# Patient Record
Sex: Female | Born: 1965 | Race: White | Hispanic: No | Marital: Single | Smoking: Never smoker
Health system: Southern US, Community
[De-identification: ages and names within clinical notes are randomized; demographics above are authoritative.]

## PROBLEM LIST (undated history)

## (undated) DIAGNOSIS — F32A Depression, unspecified: Secondary | ICD-10-CM

## (undated) DIAGNOSIS — F329 Major depressive disorder, single episode, unspecified: Secondary | ICD-10-CM

## (undated) HISTORY — DX: Depression, unspecified: F32.A

## (undated) HISTORY — DX: Major depressive disorder, single episode, unspecified: F32.9

## (undated) HISTORY — PX: TUBAL LIGATION: SHX77

## (undated) HISTORY — PX: CHOLECYSTECTOMY: SHX55

## (undated) HISTORY — PX: TONSILLECTOMY: SUR1361

---

## 1998-10-21 ENCOUNTER — Other Ambulatory Visit: Admission: RE | Admit: 1998-10-21 | Discharge: 1998-10-21 | Payer: Self-pay | Admitting: *Deleted

## 1999-11-18 ENCOUNTER — Other Ambulatory Visit: Admission: RE | Admit: 1999-11-18 | Discharge: 1999-11-18 | Payer: Self-pay | Admitting: *Deleted

## 2000-12-20 ENCOUNTER — Other Ambulatory Visit: Admission: RE | Admit: 2000-12-20 | Discharge: 2000-12-20 | Payer: Self-pay | Admitting: *Deleted

## 2002-01-02 ENCOUNTER — Other Ambulatory Visit: Admission: RE | Admit: 2002-01-02 | Discharge: 2002-01-02 | Payer: Self-pay | Admitting: *Deleted

## 2003-01-21 ENCOUNTER — Other Ambulatory Visit: Admission: RE | Admit: 2003-01-21 | Discharge: 2003-01-21 | Payer: Self-pay | Admitting: *Deleted

## 2004-02-20 ENCOUNTER — Other Ambulatory Visit: Admission: RE | Admit: 2004-02-20 | Discharge: 2004-02-20 | Payer: Self-pay | Admitting: Obstetrics and Gynecology

## 2005-09-12 ENCOUNTER — Other Ambulatory Visit: Admission: RE | Admit: 2005-09-12 | Discharge: 2005-09-12 | Payer: Self-pay | Admitting: Obstetrics and Gynecology

## 2009-03-17 ENCOUNTER — Emergency Department (HOSPITAL_COMMUNITY): Admission: EM | Admit: 2009-03-17 | Discharge: 2009-03-17 | Payer: Self-pay | Admitting: Emergency Medicine

## 2010-01-20 ENCOUNTER — Encounter: Admission: RE | Admit: 2010-01-20 | Discharge: 2010-01-20 | Payer: Self-pay | Admitting: Obstetrics and Gynecology

## 2011-08-30 ENCOUNTER — Other Ambulatory Visit: Payer: Self-pay | Admitting: Family Medicine

## 2011-08-30 ENCOUNTER — Ambulatory Visit
Admission: RE | Admit: 2011-08-30 | Discharge: 2011-08-30 | Disposition: A | Discharge status: Home / Self Care | Payer: BC Managed Care – PPO | Source: Ambulatory Visit | Attending: Family Medicine | Admitting: Family Medicine

## 2011-08-30 DIAGNOSIS — R1011 Right upper quadrant pain: Secondary | ICD-10-CM

## 2011-09-02 ENCOUNTER — Other Ambulatory Visit (HOSPITAL_COMMUNITY): Payer: Self-pay | Admitting: Family Medicine

## 2011-09-02 DIAGNOSIS — R1011 Right upper quadrant pain: Secondary | ICD-10-CM

## 2011-09-07 ENCOUNTER — Encounter (HOSPITAL_COMMUNITY)
Admission: RE | Admit: 2011-09-07 | Discharge: 2011-09-07 | Disposition: A | Discharge status: Home / Self Care | Payer: BC Managed Care – PPO | Source: Ambulatory Visit | Attending: Family Medicine | Admitting: Family Medicine

## 2011-09-07 DIAGNOSIS — R1011 Right upper quadrant pain: Secondary | ICD-10-CM

## 2011-09-07 MED ORDER — TECHNETIUM TC 99M MEBROFENIN IV KIT
5.5000 | PACK | Freq: Once | INTRAVENOUS | Status: AC | PRN
  Administered 2011-09-07: 5.5 via INTRAVENOUS

## 2011-10-14 ENCOUNTER — Other Ambulatory Visit: Payer: Self-pay | Admitting: Obstetrics and Gynecology

## 2011-10-14 DIAGNOSIS — N63 Unspecified lump in unspecified breast: Secondary | ICD-10-CM

## 2011-10-20 ENCOUNTER — Other Ambulatory Visit: Payer: BC Managed Care – PPO

## 2011-10-27 ENCOUNTER — Ambulatory Visit
Admission: RE | Admit: 2011-10-27 | Discharge: 2011-10-27 | Disposition: A | Discharge status: Home / Self Care | Payer: BC Managed Care – PPO | Source: Ambulatory Visit | Attending: Obstetrics and Gynecology | Admitting: Obstetrics and Gynecology

## 2011-10-27 ENCOUNTER — Other Ambulatory Visit: Payer: Self-pay | Admitting: Obstetrics and Gynecology

## 2011-10-27 DIAGNOSIS — N63 Unspecified lump in unspecified breast: Secondary | ICD-10-CM

## 2013-02-11 ENCOUNTER — Ambulatory Visit (INDEPENDENT_AMBULATORY_CARE_PROVIDER_SITE_OTHER): Payer: BC Managed Care – PPO | Admitting: Surgery

## 2013-02-11 ENCOUNTER — Encounter (INDEPENDENT_AMBULATORY_CARE_PROVIDER_SITE_OTHER): Payer: Self-pay | Admitting: Surgery

## 2013-02-11 VITALS — BP 140/84 | HR 88 | Resp 14 | Ht 61.0 in | Wt 212.2 lb

## 2013-02-11 DIAGNOSIS — R1011 Right upper quadrant pain: Secondary | ICD-10-CM

## 2013-02-11 NOTE — Progress Notes (Signed)
Patient ID: SOLITA MACADAM, female   DOB: [birth year], 47 y.o.   MRN: [id number]  No chief complaint on file.   HPI HENNESY SOBALVARRO is a 47 y.o. female.  Patient sent at the request of Dr. Dulce Sellar for right upper quadrant pain after eating. She has a two year history of intermittent right upper quadrant pain in her abdomen which is sharp in nature after eating. The symptoms come and go but never fully resolved. She has undergone extensive workup which shows no underlying cause. Ultrasound, EGD and HIDA showed no reason for her pain. The pain is sharp in nature at times severe lasting hours to days. No specific food can reproduce symptoms consistently. HPI  Past Medical History  Diagnosis Date  . Depression     Past Surgical History  Procedure Laterality Date  . Tonsillectomy      Family History  Problem Relation Age of Onset  . Depression Mother   . Hypertension Mother   . Diabetes Father   . Stroke Father   . Depression Sister   . Hypertension Sister     Social History History  Substance Use Topics  . Smoking status: Never Smoker   . Smokeless tobacco: Not on file  . Alcohol Use: Yes    No Known Allergies  Current Outpatient Prescriptions  Medication Sig Dispense Refill  . cetirizine (ZYRTEC) 10 MG tablet Take 10 mg by mouth daily.      Marland Kitchen dicyclomine (BENTYL) 10 MG capsule       . escitalopram (LEXAPRO) 20 MG tablet        No current facility-administered medications for this visit.    Review of Systems Review of Systems  Constitutional: Positive for unexpected weight change.  HENT: Positive for ear pain and congestion.   Respiratory: Positive for cough.   Cardiovascular: Negative.   Gastrointestinal: Positive for abdominal pain.  Genitourinary: Negative.   Musculoskeletal: Positive for arthralgias.  Neurological: Negative.     Blood pressure 140/84, pulse 88, resp. rate 14, height 5\' 1"  (1.549 m), weight 212 lb 3.2 oz (96.253 kg).  Physical  Exam Physical Exam  Constitutional: She is oriented to person, place, and time. She appears well-developed and well-nourished.  HENT:  Head: Normocephalic and atraumatic.  Eyes: EOM are normal. Pupils are equal, round, and reactive to light.  Neck: Neck supple.  Cardiovascular: Normal rate.   Pulmonary/Chest: Effort normal and breath sounds normal.  Abdominal: Soft. Bowel sounds are normal. She exhibits no distension. There is no tenderness.  Neurological: She is alert and oriented to person, place, and time.  Skin: Skin is warm and dry.  Psychiatric: She has a normal mood and affect. Her behavior is normal. Judgment and thought content normal.    Data Reviewed Dr Hulen Shouts notes,  EGD report  HIDA and U/S  ABDOMEN  Assessment    Right upper quadrant abdominal pain with negative workup    Plan    Long discussion about options. Workup is negative at this point in time if she continues to have postprandial right upper quadrant pain after eating. Benefit of cholecystectomy at relieving symptoms roughly 50%. Medical management discussed as well. Risks discussed of surgery. She would like to proceed.The procedure has been discussed with the patient. Operative and non operative treatments have been discussed. Risks of surgery include bleeding, infection,  Common bile duct injury,  Injury to the stomach,liver, colon,small intestine, abdominal wall,  Diaphragm,  Major blood vessels,  And the need for an open  procedure.  Other risks include worsening of medical problems, death,  DVT and pulmonary embolism, and cardiovascular events.   Medical options have also been discussed. The patient has been informed of long term expectations of surgery and non surgical options,  The patient agrees to proceed.         Carlos Heber A. 02/11/2013, 10:01 AM

## 2013-02-11 NOTE — Patient Instructions (Signed)

## 2013-02-14 ENCOUNTER — Encounter (INDEPENDENT_AMBULATORY_CARE_PROVIDER_SITE_OTHER): Payer: Self-pay

## 2013-04-09 ENCOUNTER — Telehealth (INDEPENDENT_AMBULATORY_CARE_PROVIDER_SITE_OTHER): Payer: Self-pay

## 2013-04-09 NOTE — Telephone Encounter (Signed)
Message copied by Brennan Bailey on Tue Apr 09, 2013  3:05 PM ------      Message from: Jari Sportsman      Created: Mon Apr 08, 2013 12:19 PM      Regarding: pt states coordination case w OB/GYN       Pt called wants to coordinate surgery with Dr Jannet Mantis at [facility]      Katie  ------

## 2013-04-09 NOTE — Telephone Encounter (Signed)
LMOM. She will need to come in for follow up visit to schedule surgery since its been 2 months since she has been seen.

## 2013-04-15 ENCOUNTER — Ambulatory Visit (INDEPENDENT_AMBULATORY_CARE_PROVIDER_SITE_OTHER): Payer: BC Managed Care – PPO | Admitting: Surgery

## 2013-04-15 ENCOUNTER — Encounter (INDEPENDENT_AMBULATORY_CARE_PROVIDER_SITE_OTHER): Payer: Self-pay | Admitting: Surgery

## 2013-04-15 VITALS — BP 144/88 | HR 108 | Temp 97.6°F | Resp 18 | Ht 61.0 in | Wt 220.0 lb

## 2013-04-15 DIAGNOSIS — K828 Other specified diseases of gallbladder: Secondary | ICD-10-CM

## 2013-04-15 NOTE — Progress Notes (Signed)
Patient ID: Chloe Meyer, female   DOB: Aug 18, 1966, 47 y.o.   MRN: [id number]  Chief Complaint  Patient presents with  . Routine Post Op    reck GB discuss sx    HPI [name] is a 47 y.o. female.  Patient sent at the request of Dr. Dulce Sellar for right upper quadrant pain after eating. She has a two year history of intermittent right upper quadrant pain in her abdomen which is sharp in nature after eating. The symptoms come and go but never fully resolved. She has undergone extensive workup which shows no underlying cause. Ultrasound, EGD and HIDA showed no reason for her pain. The pain is sharp in nature at times severe lasting hours to days. No specific food can reproduce symptoms consistently. She returns today to talk about surgery and also about coordinating tubal ligation. HPI  Past Medical History  Diagnosis Date  . Depression     Past Surgical History  Procedure Laterality Date  . Tonsillectomy      Family History  Problem Relation Age of Onset  . Depression Mother   . Hypertension Mother   . Diabetes Father   . Stroke Father   . Depression Sister   . Hypertension Sister     Social History History  Substance Use Topics  . Smoking status: Never Smoker   . Smokeless tobacco: Not on file  . Alcohol Use: Yes    No Known Allergies  Current Outpatient Prescriptions  Medication Sig Dispense Refill  . cetirizine (ZYRTEC) 10 MG tablet Take 10 mg by mouth daily.      Marland Kitchen dicyclomine (BENTYL) 10 MG capsule       . escitalopram (LEXAPRO) 20 MG tablet        No current facility-administered medications for this visit.    Review of Systems Review of Systems  Constitutional: Positive for unexpected weight change.  HENT: Positive for ear pain and congestion.   Respiratory: Positive for cough.   Cardiovascular: Negative.   Gastrointestinal: Positive for abdominal pain.  Genitourinary: Negative.   Musculoskeletal: Positive for arthralgias.  Neurological:  Negative.     Blood pressure 144/88, pulse 108, temperature 97.6 F (36.4 C), temperature source Temporal, resp. rate 18, height 5\' 1"  (1.549 m), weight 220 lb (99.791 kg).  Physical Exam Physical Exam  Constitutional: She is oriented to person, place, and time. She appears well-developed and well-nourished.  HENT:  Head: Normocephalic and atraumatic.  Eyes: EOM are normal. Pupils are equal, round, and reactive to light.  Neck: Neck supple.  Cardiovascular: Normal rate.   Pulmonary/Chest: Effort normal and breath sounds normal.  Abdominal: Soft. Bowel sounds are normal. She exhibits no distension. There is no tenderness.  Neurological: She is alert and oriented to person, place, and time.  Skin: Skin is warm and dry.  Psychiatric: She has a normal mood and affect. Her behavior is normal. Judgment and thought content normal.    Data Reviewed Dr Hulen Shouts notes,  EGD report  HIDA and U/S  ABDOMEN  Assessment    Right upper quadrant abdominal pain with negative workup    Plan    Long discussion about options. Workup is negative at this point in time if she continues to have postprandial right upper quadrant pain after eating. Benefit of cholecystectomy at relieving symptoms roughly 50%. Medical management discussed as well. Risks discussed of surgery. She would like to proceed.The procedure has been discussed with the patient. Operative and non operative treatments have been discussed.  Risks of surgery include bleeding, infection,  Common bile duct injury,  Injury to the stomach,liver, colon,small intestine, abdominal wall,  Diaphragm,  Major blood vessels,  And the need for an open procedure.  Other risks include worsening of medical problems, death,  DVT and pulmonary embolism, and cardiovascular events.   Medical options have also been discussed. The patient has been informed of long term expectations of surgery and non surgical options,  The patient agrees to proceed. She desires tubal  ligation and will coordinate with Dr. Henderson Cloud.         Efrem Pitstick A. 04/15/2013, 12:58 PM

## 2013-04-15 NOTE — Patient Instructions (Signed)

## 2013-06-11 ENCOUNTER — Other Ambulatory Visit (INDEPENDENT_AMBULATORY_CARE_PROVIDER_SITE_OTHER): Payer: Self-pay | Admitting: Surgery

## 2013-06-11 ENCOUNTER — Other Ambulatory Visit (INDEPENDENT_AMBULATORY_CARE_PROVIDER_SITE_OTHER): Payer: Self-pay | Admitting: *Deleted

## 2013-06-11 ENCOUNTER — Other Ambulatory Visit: Payer: Self-pay | Admitting: Obstetrics and Gynecology

## 2013-06-11 DIAGNOSIS — K824 Cholesterolosis of gallbladder: Secondary | ICD-10-CM

## 2013-06-11 DIAGNOSIS — K811 Chronic cholecystitis: Secondary | ICD-10-CM

## 2013-06-11 DIAGNOSIS — R1011 Right upper quadrant pain: Secondary | ICD-10-CM

## 2013-06-11 MED ORDER — OXYCODONE-ACETAMINOPHEN 5-325 MG PO TABS: 1.0000 | ORAL_TABLET | ORAL | Status: DC | PRN

## 2013-06-11 MED ORDER — PROMETHAZINE HCL 12.5 MG PO TABS: 12.5000 mg | ORAL_TABLET | Freq: Four times a day (QID) | ORAL | Status: DC | PRN

## 2013-06-14 ENCOUNTER — Telehealth (INDEPENDENT_AMBULATORY_CARE_PROVIDER_SITE_OTHER): Payer: Self-pay

## 2013-06-14 NOTE — Telephone Encounter (Signed)
Pt is post op lap chole and tubal ligation.  She is requesting an extension on her RTW.  Medical records has received her paperwork, but she has not paid her $20 fee.  I explained that she should pay her $20 and a message would be forwarded to Dr. Luisa Hart and his nurse, so her paperwork can be processed.

## 2013-06-14 NOTE — Telephone Encounter (Signed)
RTW date extended to 07/01/13 on FMLA papaerwork. A note was given to Bonita Quin to put RTW date of 9/29 on [employer] when they get filled out.

## 2013-06-27 ENCOUNTER — Encounter (INDEPENDENT_AMBULATORY_CARE_PROVIDER_SITE_OTHER): Payer: BC Managed Care – PPO | Admitting: Surgery

## 2013-07-01 ENCOUNTER — Encounter (INDEPENDENT_AMBULATORY_CARE_PROVIDER_SITE_OTHER): Payer: Self-pay | Admitting: Surgery

## 2013-07-01 ENCOUNTER — Ambulatory Visit (INDEPENDENT_AMBULATORY_CARE_PROVIDER_SITE_OTHER): Payer: BC Managed Care – PPO | Admitting: Surgery

## 2013-07-01 VITALS — BP 140/100 | HR 84 | Temp 98.4°F | Resp 15 | Ht 61.0 in | Wt 219.4 lb

## 2013-07-01 DIAGNOSIS — Z9889 Other specified postprocedural states: Secondary | ICD-10-CM

## 2013-07-01 NOTE — Progress Notes (Signed)
she is here for a postop visit following laparoscopic cholecystectomy.  Diet is being tolerated, bowels are moving.  No problems with incisions except for stitch at umbilicus.  PE:  ABD:  Soft, incisions clean/dry/intact and solid.  Assessment:  Doing well postop.  Plan:  Lowfat diet recommended.  Activities as tolerated.  Return visit prn.

## 2013-07-01 NOTE — Patient Instructions (Signed)
Resume full activity. Return as needed.  

## 2016-07-14 ENCOUNTER — Ambulatory Visit: Payer: Self-pay | Admitting: Dietician

## 2016-08-15 ENCOUNTER — Emergency Department (HOSPITAL_COMMUNITY): Payer: PRIVATE HEALTH INSURANCE

## 2016-08-15 ENCOUNTER — Observation Stay (HOSPITAL_COMMUNITY)
Admission: EM | Admit: 2016-08-15 | Discharge: 2016-08-18 | Disposition: A | Discharge status: Home / Self Care | Payer: PRIVATE HEALTH INSURANCE | Attending: Internal Medicine | Admitting: Internal Medicine

## 2016-08-15 ENCOUNTER — Encounter (HOSPITAL_COMMUNITY): Payer: Self-pay

## 2016-08-15 DIAGNOSIS — R42 Dizziness and giddiness: Principal | ICD-10-CM | POA: Insufficient documentation

## 2016-08-15 DIAGNOSIS — E119 Type 2 diabetes mellitus without complications: Secondary | ICD-10-CM | POA: Insufficient documentation

## 2016-08-15 DIAGNOSIS — R112 Nausea with vomiting, unspecified: Secondary | ICD-10-CM | POA: Diagnosis present

## 2016-08-15 DIAGNOSIS — F32A Depression, unspecified: Secondary | ICD-10-CM

## 2016-08-15 DIAGNOSIS — Z79899 Other long term (current) drug therapy: Secondary | ICD-10-CM | POA: Insufficient documentation

## 2016-08-15 DIAGNOSIS — F329 Major depressive disorder, single episode, unspecified: Secondary | ICD-10-CM | POA: Insufficient documentation

## 2016-08-15 DIAGNOSIS — Z7984 Long term (current) use of oral hypoglycemic drugs: Secondary | ICD-10-CM | POA: Insufficient documentation

## 2016-08-15 LAB — CBC WITH DIFFERENTIAL/PLATELET
BASOS ABS: 0 10*3/uL (ref 0.0–0.1)
BASOS PCT: 0 %
Eosinophils Absolute: 0.1 10*3/uL (ref 0.0–0.7)
Eosinophils Relative: 1 %
HEMATOCRIT: 41.9 % (ref 36.0–46.0)
HEMOGLOBIN: 13.6 g/dL (ref 12.0–15.0)
LYMPHS PCT: 14 %
Lymphs Abs: 1.5 10*3/uL (ref 0.7–4.0)
MCH: 29.1 pg (ref 26.0–34.0)
MCHC: 32.5 g/dL (ref 30.0–36.0)
MCV: 89.7 fL (ref 78.0–100.0)
MONO ABS: 0.3 10*3/uL (ref 0.1–1.0)
Monocytes Relative: 3 %
NEUTROS ABS: 9.1 10*3/uL — AB (ref 1.7–7.7)
NEUTROS PCT: 82 %
Platelets: 443 10*3/uL — ABNORMAL HIGH (ref 150–400)
RBC: 4.67 MIL/uL (ref 3.87–5.11)
RDW: 13.9 % (ref 11.5–15.5)
WBC: 11 10*3/uL — ABNORMAL HIGH (ref 4.0–10.5)

## 2016-08-15 LAB — BASIC METABOLIC PANEL
ANION GAP: 8 (ref 5–15)
BUN: 14 mg/dL (ref 6–20)
CO2: 26 mmol/L (ref 22–32)
Calcium: 8.7 mg/dL — ABNORMAL LOW (ref 8.9–10.3)
Chloride: 106 mmol/L (ref 101–111)
Creatinine, Ser: 0.6 mg/dL (ref 0.44–1.00)
GLUCOSE: 163 mg/dL — AB (ref 65–99)
POTASSIUM: 4.3 mmol/L (ref 3.5–5.1)
Sodium: 140 mmol/L (ref 135–145)

## 2016-08-15 LAB — I-STAT TROPONIN, ED: Troponin i, poc: 0.01 ng/mL (ref 0.00–0.08)

## 2016-08-15 MED ORDER — ONDANSETRON HCL 4 MG/2ML IJ SOLN
4.0000 mg | Freq: Once | INTRAMUSCULAR | Status: AC
  Administered 2016-08-15: 4 mg via INTRAVENOUS
  Filled 2016-08-15: qty 2

## 2016-08-15 MED ORDER — DIAZEPAM 5 MG/ML IJ SOLN
2.5000 mg | Freq: Once | INTRAMUSCULAR | Status: AC
  Administered 2016-08-15: 2.5 mg via INTRAVENOUS
  Filled 2016-08-15: qty 2

## 2016-08-15 MED ORDER — MECLIZINE HCL 25 MG PO TABS
25.0000 mg | ORAL_TABLET | Freq: Once | ORAL | Status: AC
  Administered 2016-08-15: 25 mg via ORAL
  Filled 2016-08-15: qty 1

## 2016-08-15 MED ORDER — LORAZEPAM 2 MG/ML IJ SOLN
0.5000 mg | Freq: Once | INTRAMUSCULAR | Status: AC
  Administered 2016-08-15: 0.5 mg via INTRAVENOUS
  Filled 2016-08-15: qty 1

## 2016-08-15 NOTE — ED Notes (Signed)
Patient transported to CT 

## 2016-08-15 NOTE — ED Triage Notes (Signed)
Per EMS, Pt, from home, c/o intermittent dizziness w/ movement and nausea x 2 days.  Denies pain.  EMS reports "she is ok if she keeps her eyes closed."  4mg  Zofran given en route.

## 2016-08-15 NOTE — ED Notes (Signed)
Pt returned from CT °

## 2016-08-15 NOTE — ED Notes (Signed)
Called MRI about delay.  Pt will be taken in next 15-30 min.

## 2016-08-15 NOTE — ED Notes (Signed)
Bed: WA09 Expected date:  Expected time:  Means of arrival:  Comments: EMS- 49yo F, dizziness w/ standing

## 2016-08-15 NOTE — ED Provider Notes (Signed)
[facility]-EMERGENCY DEPT Provider Note   CSN: [id number] Arrival date & time: 08/15/16  1226     History   Chief Complaint Chief Complaint  Patient presents with  . Dizziness  . Nausea    HPI Chloe Meyer is a 50 y.o. female.  The history is provided by the patient. No language interpreter was used.  Dizziness    Chloe Meyer is a 50 y.o. female who presents to the Emergency Department complaining of dizziness.  Two days ago she began to feel dizzy/vertiginous.  Sxs are waxing and waning.  The dizziness has been constant today - no change with position.  She has associated nausea, vomiting, headache.  Denies fevers, chest pain, sob, numbness, weakness.    She ran out of lexapro a week ago and restarted it yesterday.    Past Medical History:  Diagnosis Date  . Depression     There are no active problems to display for this patient.   Past Surgical History:  Procedure Laterality Date  . CHOLECYSTECTOMY    . TONSILLECTOMY      OB History    No data available       Home Medications    Prior to Admission medications   Medication Sig Start Date End Date Taking? Authorizing Provider  cetirizine (ZYRTEC) 10 MG tablet Take 10 mg by mouth daily.   Yes Historical Provider, MD  escitalopram (LEXAPRO) 20 MG tablet Take 20 mg by mouth at bedtime.  11/30/12  Yes Historical Provider, MD  fluocinonide cream (LIDEX) 0.05 % Apply 1 application topically 2 (two) times daily as needed (for eczema on arms).   Yes Historical Provider, MD  metFORMIN (GLUCOPHAGE-XR) 500 MG 24 hr tablet Take 1 mg by mouth every evening. 06/21/16  Yes Historical Provider, MD  naproxen sodium (ANAPROX) 220 MG tablet Take 220 mg by mouth 2 (two) times daily with a meal.   Yes Historical Provider, MD    Family History Family History  Problem Relation Age of Onset  . Depression Mother   . Hypertension Mother   . Diabetes Father   . Stroke Father   . Depression Sister   . Hypertension Sister      Social History Social History  Substance Use Topics  . Smoking status: Never Smoker  . Smokeless tobacco: Never Used  . Alcohol use Yes     Allergies   Patient has no known allergies.   Review of Systems Review of Systems  Neurological: Positive for dizziness.  All other systems reviewed and are negative.    Physical Exam Updated Vital Signs BP 133/80 (BP Location: Right Arm)   Pulse 78   Temp 98.5 F (36.9 C)   Resp 18   SpO2 95% Comment: Simultaneous filing. User may not have seen previous data.  Physical Exam  Constitutional: She is oriented to person, place, and time. She appears well-developed and well-nourished.  HENT:  Head: Normocephalic and atraumatic.  TMs clear bilaterally.  Mild erythema of right auditory canal  Eyes: EOM are normal. Pupils are equal, round, and reactive to light.  Nystagmus on gaze to the left.   Cardiovascular: Normal rate and regular rhythm.   No murmur heard. Pulmonary/Chest: Effort normal and breath sounds normal. No respiratory distress.  Abdominal: Soft. There is no tenderness. There is no rebound and no guarding.  Musculoskeletal: She exhibits no edema or tenderness.  Neurological: She is alert and oriented to person, place, and time.  No facial asymmetry.  5/5 strength  in all four extremities.  Sensation to light touch intact in all four extremities.    Skin: Skin is warm and dry.  Psychiatric: She has a normal mood and affect. Her behavior is normal.  Nursing note and vitals reviewed.    ED Treatments / Results  Labs (all labs ordered are listed, but only abnormal results are displayed) Labs Reviewed - No data to display  EKG  EKG Interpretation None       Radiology No results found.  Procedures Procedures (including critical care time)  Medications Ordered in ED Medications - No data to display   Initial Impression / Assessment and Plan / ED Course  I have reviewed the triage vital signs and the  nursing notes.  Pertinent labs & imaging results that were available during my care of the patient were reviewed by me and considered in my medical decision making (see chart for details).  Clinical Course     Patient here for evaluation of vertigo and nausea. She has persistent nystagmus on examination. On repeat evaluation following meclizine and Zofran she has ongoing vertigo and nystagmus, will add Ativan. Checking MRI to eval for cerebellar lesion.  Patient care transferred pending MRI.    Final Clinical Impressions(s) / ED Diagnoses   Final diagnoses:  Vertigo    New Prescriptions New Prescriptions   No medications on file     Tilden FossaElizabeth Sianna Garofano, MD 08/16/16 1314

## 2016-08-16 ENCOUNTER — Encounter (HOSPITAL_COMMUNITY): Payer: Self-pay | Admitting: Internal Medicine

## 2016-08-16 DIAGNOSIS — R112 Nausea with vomiting, unspecified: Secondary | ICD-10-CM | POA: Diagnosis not present

## 2016-08-16 DIAGNOSIS — R42 Dizziness and giddiness: Secondary | ICD-10-CM | POA: Diagnosis not present

## 2016-08-16 DIAGNOSIS — E119 Type 2 diabetes mellitus without complications: Secondary | ICD-10-CM | POA: Diagnosis not present

## 2016-08-16 LAB — CBC
HCT: 37.8 % (ref 36.0–46.0)
Hemoglobin: 12.4 g/dL (ref 12.0–15.0)
MCH: 29.2 pg (ref 26.0–34.0)
MCHC: 32.8 g/dL (ref 30.0–36.0)
MCV: 88.9 fL (ref 78.0–100.0)
PLATELETS: 410 10*3/uL — AB (ref 150–400)
RBC: 4.25 MIL/uL (ref 3.87–5.11)
RDW: 14 % (ref 11.5–15.5)
WBC: 12 10*3/uL — AB (ref 4.0–10.5)

## 2016-08-16 LAB — COMPREHENSIVE METABOLIC PANEL
ALK PHOS: 60 U/L (ref 38–126)
ALT: 14 U/L (ref 14–54)
AST: 16 U/L (ref 15–41)
Albumin: 3.6 g/dL (ref 3.5–5.0)
Anion gap: 7 (ref 5–15)
BUN: 13 mg/dL (ref 6–20)
CALCIUM: 8.9 mg/dL (ref 8.9–10.3)
CO2: 28 mmol/L (ref 22–32)
CREATININE: 0.61 mg/dL (ref 0.44–1.00)
Chloride: 106 mmol/L (ref 101–111)
Glucose, Bld: 121 mg/dL — ABNORMAL HIGH (ref 65–99)
Potassium: 4 mmol/L (ref 3.5–5.1)
Sodium: 141 mmol/L (ref 135–145)
Total Bilirubin: 0.9 mg/dL (ref 0.3–1.2)
Total Protein: 6.5 g/dL (ref 6.5–8.1)

## 2016-08-16 LAB — PHOSPHORUS: Phosphorus: 3.4 mg/dL (ref 2.5–4.6)

## 2016-08-16 LAB — GLUCOSE, CAPILLARY
GLUCOSE-CAPILLARY: 120 mg/dL — AB (ref 65–99)
GLUCOSE-CAPILLARY: 138 mg/dL — AB (ref 65–99)
GLUCOSE-CAPILLARY: 108 mg/dL — AB (ref 65–99)
Glucose-Capillary: 123 mg/dL — ABNORMAL HIGH (ref 65–99)
Glucose-Capillary: 98 mg/dL (ref 65–99)

## 2016-08-16 LAB — MAGNESIUM: Magnesium: 2.3 mg/dL (ref 1.7–2.4)

## 2016-08-16 LAB — TSH: TSH: 0.965 u[IU]/mL (ref 0.350–4.500)

## 2016-08-16 MED ORDER — ACETAMINOPHEN 650 MG RE SUPP: 650.0000 mg | Freq: Four times a day (QID) | RECTAL | Status: DC | PRN

## 2016-08-16 MED ORDER — SODIUM CHLORIDE 0.9 % IV SOLN
INTRAVENOUS | Status: AC
  Administered 2016-08-16 (×2): via INTRAVENOUS

## 2016-08-16 MED ORDER — ACETAMINOPHEN 325 MG PO TABS
650.0000 mg | ORAL_TABLET | Freq: Four times a day (QID) | ORAL | Status: DC | PRN
  Administered 2016-08-16 – 2016-08-17 (×4): 650 mg via ORAL
  Filled 2016-08-16 (×4): qty 2

## 2016-08-16 MED ORDER — MECLIZINE HCL 25 MG PO TABS
25.0000 mg | ORAL_TABLET | Freq: Three times a day (TID) | ORAL | Status: DC | PRN
  Administered 2016-08-16: 25 mg via ORAL
  Filled 2016-08-16 (×2): qty 1

## 2016-08-16 MED ORDER — ONDANSETRON HCL 4 MG/2ML IJ SOLN: 4.0000 mg | Freq: Four times a day (QID) | INTRAMUSCULAR | Status: DC | PRN

## 2016-08-16 MED ORDER — LORAZEPAM 2 MG/ML IJ SOLN: 0.5000 mg | Freq: Four times a day (QID) | INTRAMUSCULAR | Status: DC | PRN

## 2016-08-16 MED ORDER — SODIUM CHLORIDE 0.9% FLUSH
3.0000 mL | Freq: Two times a day (BID) | INTRAVENOUS | Status: DC
  Administered 2016-08-16 – 2016-08-17 (×2): 3 mL via INTRAVENOUS

## 2016-08-16 MED ORDER — HYDROCODONE-ACETAMINOPHEN 5-325 MG PO TABS: 1.0000 | ORAL_TABLET | ORAL | Status: DC | PRN

## 2016-08-16 MED ORDER — INSULIN ASPART 100 UNIT/ML ~~LOC~~ SOLN
0.0000 [IU] | Freq: Every day | SUBCUTANEOUS | Status: DC
  Administered 2016-08-17: 3 [IU] via SUBCUTANEOUS

## 2016-08-16 MED ORDER — ONDANSETRON HCL 4 MG PO TABS
4.0000 mg | ORAL_TABLET | Freq: Four times a day (QID) | ORAL | Status: DC | PRN
Start: 2016-08-16 — End: 2016-08-18

## 2016-08-16 MED ORDER — INSULIN ASPART 100 UNIT/ML ~~LOC~~ SOLN
0.0000 [IU] | Freq: Three times a day (TID) | SUBCUTANEOUS | Status: DC
  Administered 2016-08-16: 1 [IU] via SUBCUTANEOUS
  Administered 2016-08-17: 3 [IU] via SUBCUTANEOUS
  Administered 2016-08-17: 1 [IU] via SUBCUTANEOUS

## 2016-08-16 MED ORDER — LORATADINE 10 MG PO TABS
10.0000 mg | ORAL_TABLET | Freq: Every day | ORAL | Status: DC
Start: 2016-08-16 — End: 2016-08-18
  Administered 2016-08-16 – 2016-08-18 (×3): 10 mg via ORAL
  Filled 2016-08-16 (×3): qty 1

## 2016-08-16 MED ORDER — ESCITALOPRAM OXALATE 20 MG PO TABS
20.0000 mg | ORAL_TABLET | Freq: Every day | ORAL | Status: DC
  Administered 2016-08-16 – 2016-08-17 (×3): 20 mg via ORAL
  Filled 2016-08-16 (×3): qty 1

## 2016-08-16 NOTE — Evaluation (Signed)
Physical Therapy Evaluation Patient Details Name: Chloe Meyer MRN: [id number] DOB: [birth year] Today's Date: 08/16/2016   History of Present Illness  Pt was admitted 08/15/16 for vertigo associated with nausea, worse with movement.  PMH significant for depression.  Pt states that symptoms started Saturday night and she went to sleep.  She had some symptoms Sunday but was still able to function and drive.  Symptoms became worse on Monday and she presented to ED.  Pt states that L eye feels funny.  She also states that she has had ear problems in the past, but not like this  Clinical Impression  Pt admitted with above diagnosis. Pt currently with functional limitations due to the deficits listed below (see PT Problem List).  Pt will benefit from skilled PT to increase their independence and safety with mobility to allow discharge to the venue listed below.  Pt with slight nystagmus at rest.  Pt reports constant spinning however standing and mobility aggravate symptoms.  Pt would best benefit from outpatient vestibular PT however pt likely unable to have ride so would recommend HHPT if not possible.    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       Follow Up Recommendations Home health PT;Outpatient PT (if pt could have ride to OPPT for vestibular, if not HHPT)    Equipment Recommendations  Rolling walker with 5" wheels     Recommendations for Other Services       Precautions / Restrictions Precautions Precautions: Fall Restrictions Weight Bearing Restrictions: No      Mobility  Bed Mobility Overal bed mobility: Needs Assistance Bed Mobility: Supine to Sit     Supine to sit: Supervision     General bed mobility comments: slow due to dizziness  Transfers Overall transfer level: Needs assistance Equipment used: None Transfers: Sit to/from Stand Sit to Stand: Min guard Stand pivot transfers: Min guard       General transfer comment: min/guard for safety, utilized bed rail and grab bar in bathroom  Ambulation/Gait Ambulation/Gait assistance: Min guard Ambulation Distance (Feet): 20 Feet Assistive device: None Gait Pattern/deviations: Step-through pattern;Decreased stride length     General Gait Details: very unsteady gait however held onto IV pole and furniture/walls for ambulating to/from bathroom (requested to not use BSC), min/guard for safety with dizziness, would benefit from RW next visist   Stairs            Wheelchair Mobility    Modified Rankin (Stroke Patients Only)       Balance                                             Pertinent Vitals/Pain Pain Assessment: No/denies pain Faces Pain Scale: Hurts little more Pain Location: headache Pain Descriptors / Indicators: Aching Pain Intervention(s): Limited activity within patient's tolerance;Monitored during session;Patient requesting pain meds-RN notified  Home Living Family/patient expects to be discharged to:: Private residence Living Arrangements: Alone Available Help at Discharge:  (mother can come stay; doesn't drive.  More assist at night) Type of Home:  (condo) Home Access: Level entry       Home Equipment: None      Prior Function Level of Independence: Independent         Comments: works     Higher education careers adviserHand Dominance        Extremity/Trunk Assessment   Upper Extremity  Assessment: Overall WFL for tasks assessed           Lower Extremity Assessment: Overall WFL for tasks assessed         Communication   Communication: No difficulties  Cognition Arousal/Alertness: Awake/alert Behavior During Therapy: WFL for tasks assessed/performed Overall Cognitive Status: Within Functional Limits for tasks assessed                      General Comments      Exercises     Assessment/Plan    PT Assessment Patient needs continued PT services  PT Problem List Decreased activity tolerance;Decreased balance;Other (comment);Decreased mobility (vertigo, possible R hypofunction)          PT Treatment Interventions DME instruction;Gait training;Functional mobility training;Therapeutic exercise;Therapeutic activities;Balance training (compensation techniques, gaze stability exercises)    PT Goals (Current goals can be found in the Care Plan section)  Acute Rehab PT Goals Patient Stated Goal: return to independence PT Goal Formulation: With patient Time For Goal Achievement: 08/23/16 Potential to Achieve Goals: Good    Frequency Min 3X/week   Barriers to discharge        Co-evaluation        End of Session Equipment Utilized During Treatment: Gait belt Activity Tolerance: Patient tolerated treatment well Patient left: in chair;with call bell/phone within reach (aware to use call bell for any mobility for safety) Nurse Communication: Mobility status (request for meds)    Functional Assessment Tool Used: clinical judgement Functional Limitation: Mobility: Walking and moving around Mobility: Walking and Moving Around Current Status ([id number](G8978): At least 1 percent but less than 20 percent impaired, limited or restricted Mobility: Walking and Moving Around Goal Status [phone](G8979): At least 1 percent but less than 20 percent impaired, limited or restricted    Time: [id number] PT Time Calculation (min) (ACUTE ONLY): 16 min   Charges:   PT  Evaluation $PT Eval Moderate Complexity: 1 Procedure     PT G Codes:   PT G-Codes **NOT FOR INPATIENT CLASS** Functional Assessment Tool Used: clinical judgement Functional Limitation: Mobility: Walking and moving around Mobility: Walking and Moving Around Current Status (V2536(G8978): At least 1 percent but less than 20 percent impaired, limited or restricted Mobility: Walking and Moving Around Goal Status [phone](G8979): At least 1 percent but less than 20 percent impaired, limited or restricted    Chloe Meyer,KATHrine E 08/16/2016, 4:49 PM Zenovia JarredKati Mariah Harn, PT, DPT 08/16/2016 Pager: [phone]

## 2016-08-16 NOTE — H&P (Signed)
[name]:[id number] DOB: [birth year] DOA: 08/15/2016     PCP: Clarnce Flock, RN (Inactive)   Outpatient Specialists: none   Patient coming from:   home Lives alone,  Chief Complaint: vertigo  HPI: UNKNOWN [name] is a 50 y.o. female with medical history significant of depression    Presented with vertigo for 2 days associated with nausea being made worse by movement. Not associated with any pain or headache. Made better by keeping her eyes closed. Patient called EMS she was given 4 mg of Zofran en route. Patient endorses occasional headache. No fevers or chills no chest pain or shortness of breath otherwise no associated neurological complaints. Only change in medications that she ran out of Lexapro week ago and restarted it yesterday. She had recent flu shot and did not feel well but denies any ear pain no trouble hearing no tinnitus. Reports her ears don't feel right sensation of fullness in her ears bilaterally. She never had vertigo prior to this. She reports minimal vertigo at rest with significant worsening with movement.  She has history of migraine headaches but currently has been asymptomatic for many years  Patient is known history of depression treated with Lexapro she also has been recently diagnosed with diabetes on metformin.    IN ER:  Temp (24hrs), Avg:98.6 F (37 C), Min:98.5 F (36.9 C), Max:98.6 F (37 C) Troponin 0.01 sodium 140 potassium 4.3 creatinine 0.6 WBC 11 hemoglobin 13.6 CT head unremarkable MRI brain showing nonspecific scattered foci of  white matter hyperintensity. Following Medications were ordered in ER: Medications  meclizine (ANTIVERT) tablet 25 mg (25 mg Oral Given 08/15/16 1427)  LORazepam (ATIVAN) injection 0.5 mg (0.5 mg Intravenous Given 08/15/16 1557)  ondansetron (ZOFRAN) injection 4 mg (4 mg Intravenous Given 08/15/16 1921)  diazepam (VALIUM) injection 2.5 mg (2.5 mg Intravenous Given 08/15/16 2312)  meclizine (ANTIVERT)  tablet 25 mg (25 mg Oral Given 08/15/16 2312)     Hospitalist was called for admission for symptomatic vertigo  Review of Systems:    Pertinent positives include: vertigonausea, vomiting  Constitutional:  No weight loss, night sweats, Fevers, chills, fatigue, weight loss  HEENT:  No headaches, Difficulty swallowing,Tooth/dental problems,Sore throat,  No sneezing, itching, ear ache, nasal congestion, post nasal drip,  Cardio-vascular:  No chest pain, Orthopnea, PND, anasarca, dizziness, palpitations.no Bilateral lower extremity swelling  GI:  No heartburn, indigestion, abdominal pain,  diarrhea, change in bowel habits, loss of appetite, melena, blood in stool, hematemesis Resp:  no shortness of breath at rest. No dyspnea on exertion, No excess mucus, no productive cough, No non-productive cough, No coughing up of blood.No change in color of mucus.No wheezing. Skin:  no rash or lesions. No jaundice GU:  no dysuria, change in color of urine, no urgency or frequency. No straining to urinate.  No flank pain.  Musculoskeletal:  No joint pain or no joint swelling. No decreased range of motion. No back pain.  Psych:  No change in mood or affect. No depression or anxiety. No memory loss.  Neuro: no localizing neurological complaints, no tingling, no weakness, no double vision, no gait abnormality, no slurred speech, no confusion  As per HPI otherwise 10 point review of systems negative.   Past Medical History: Past Medical History:  Diagnosis Date  . Depression    Past Surgical History:  Procedure Laterality Date  . CHOLECYSTECTOMY    . TONSILLECTOMY       Social History:  Ambulatory   Independently  reports that she has never smoked. She has never used smokeless tobacco. She reports that she drinks alcohol. She reports that she does not use drugs.  Allergies:  No Known Allergies     Family History:   Family History  Problem Relation Age of Onset  . Depression  Mother   . Hypertension Mother   . Diabetes Father   . Stroke Father   . Depression Sister   . Hypertension Sister     Medications: Prior to Admission medications   Medication Sig Start Date End Date Taking? Authorizing Provider  cetirizine (ZYRTEC) 10 MG tablet Take 10 mg by mouth daily.   Yes Historical Provider, MD  escitalopram (LEXAPRO) 20 MG tablet Take 20 mg by mouth at bedtime.  11/30/12  Yes Historical Provider, MD  fluocinonide cream (LIDEX) 0.05 % Apply 1 application topically 2 (two) times daily as needed (for eczema on arms).   Yes Historical Provider, MD  metFORMIN (GLUCOPHAGE-XR) 500 MG 24 hr tablet Take 1 mg by mouth every evening. 06/21/16  Yes Historical Provider, MD  naproxen sodium (ANAPROX) 220 MG tablet Take 220 mg by mouth 2 (two) times daily with a meal.   Yes Historical Provider, MD    Physical Exam: Patient Vitals for the past 24 hrs:  BP Temp Temp src Pulse Resp SpO2  08/15/16 2300 149/99 - - 79 - 92 %  08/15/16 2200 153/92 - - 80 16 95 %  08/15/16 2130 142/87 - - 79 16 91 %  08/15/16 2000 147/93 - - 83 19 95 %  08/15/16 1900 (!) 170/112 - - 80 14 90 %  08/15/16 1808 163/95 - - 86 18 93 %  08/15/16 1800 163/95 - - 83 14 90 %  08/15/16 1600 145/93 - - 83 10 96 %  08/15/16 1553 130/85 - - 89 19 96 %  08/15/16 1530 - - - 84 13 93 %  08/15/16 1500 130/85 - - 80 13 -  08/15/16 1439 128/79 98.6 F (37 C) Oral 74 16 96 %  08/15/16 1345 - - - 87 19 94 %  08/15/16 1232 133/80 98.5 F (36.9 C) - 78 18 95 %    1. General:  in No Acute distress 2. Psychological: Alert and   Oriented 3. Head/ENT:    Dry Mucous Membranes                          Head Non traumatic, neck supple                          Normal Dentition 4. SKIN:  decreased Skin turgor,  Skin clean Dry and intact no rash 5. Heart: Regular rate and rhythm no Murmur, Rub or gallop 6. Lungs: Clear to auscultation bilaterally, no wheezes or crackles   7. Abdomen: Soft, non-tender, Non distended 8.  Lower extremities: no clubbing, cyanosis, or edema 9. Neurologically strength 5 out of 5 in all 4 extremities cranial nerves II through XII intact, Horizontal nystagmus present 10. MSK: Normal range of motion   body mass index is unknown because there is no height or weight on file.  Labs on Admission:   Labs on Admission: I have personally reviewed following labs and imaging studies  CBC:  Recent Labs Lab 08/15/16 1342  WBC 11.0*  NEUTROABS 9.1*  HGB 13.6  HCT 41.9  MCV 89.7  PLT 443*   Basic Metabolic Panel:  Recent Labs Lab 08/15/16 1342  NA 140  K 4.3  CL 106  CO2 26  GLUCOSE 163*  BUN 14  CREATININE 0.60  CALCIUM 8.7*   GFR: Estimated Creatinine Clearance: 93.9 mL/min (by C-G formula based on SCr of 0.6 mg/dL). Liver Function Tests: No results for input(s): AST, ALT, ALKPHOS, BILITOT, PROT, ALBUMIN in the last 168 hours. No results for input(s): LIPASE, AMYLASE in the last 168 hours. No results for input(s): AMMONIA in the last 168 hours. Coagulation Profile: No results for input(s): INR, PROTIME in the last 168 hours. Cardiac Enzymes: No results for input(s): CKTOTAL, CKMB, CKMBINDEX, TROPONINI in the last 168 hours. BNP (last 3 results) No results for input(s): PROBNP in the last 8760 hours. HbA1C: No results for input(s): HGBA1C in the last 72 hours. CBG: No results for input(s): GLUCAP in the last 168 hours. Lipid Profile: No results for input(s): CHOL, HDL, LDLCALC, TRIG, CHOLHDL, LDLDIRECT in the last 72 hours. Thyroid Function Tests: No results for input(s): TSH, T4TOTAL, FREET4, T3FREE, THYROIDAB in the last 72 hours. Anemia Panel: No results for input(s): VITAMINB12, FOLATE, FERRITIN, TIBC, IRON, RETICCTPCT in the last 72 hours. Urine analysis: No results found for: COLORURINE, APPEARANCEUR, LABSPEC, PHURINE, GLUCOSEU, HGBUR, BILIRUBINUR, KETONESUR, PROTEINUR, UROBILINOGEN, NITRITE, LEUKOCYTESUR Sepsis  Labs: @LABRCNTIP (procalcitonin:4,lacticidven:4) )No results found for this or any previous visit (from the past 240 hour(s)).     UA  not ordered  No results found for: HGBA1C  Estimated Creatinine Clearance: 93.9 mL/min (by C-G formula based on SCr of 0.6 mg/dL).  BNP (last 3 results) No results for input(s): PROBNP in the last 8760 hours.   ECG REPORT  Independently reviewed Rate: 87  Rhythm: NSR ST&T Change: No acute ischemic changes   QTC 457  There were no vitals filed for this visit.   Cultures: No results found for: SDES, SPECREQUEST, CULT, REPTSTATUS   Radiological Exams on Admission: Ct Head Wo Contrast  Result Date: 08/15/2016 CLINICAL DATA:  Per EMS, Pt, from home, c/o intermittent dizziness w/ movement and nausea x 2 days. Denies pain. EMS reports "she is ok if she keeps her eyes closed." pt.states lt. Eye abnormal on exam per MD EXAM: CT HEAD WITHOUT CONTRAST TECHNIQUE: Contiguous axial images were obtained from the base of the skull through the vertex without intravenous contrast. COMPARISON:  None. FINDINGS: Brain: No evidence of acute infarction, hemorrhage, hydrocephalus, extra-axial collection or mass lesion/mass effect. Vascular: No hyperdense vessel or unexpected calcification. Skull: Normal. Negative for fracture or focal lesion. Sinuses/Orbits: No acute finding. Other: None. IMPRESSION: Negative exam. Electronically Signed   By: Norva PavlovElizabeth  Brown M.D.   On: 08/15/2016 14:33   Mr Brain Wo Contrast  Result Date: 08/15/2016 CLINICAL DATA:  Vertigo, nystagmus EXAM: MRI HEAD WITHOUT CONTRAST TECHNIQUE: Multiplanar, multiecho pulse sequences of the brain and surrounding structures were obtained without intravenous contrast. COMPARISON:  Head CT 08/15/2016 FINDINGS: Brain: No acute infarct or intraparenchymal hemorrhage. The midline structures are normal. There is multifocal hyperintense T2-weighted signal within the periventricular white matter, which may be seen in  the setting of migraine headaches or early chronic microvascular disease; however, it is also seen in normal patients of this age. No mass lesion or midline shift. No hydrocephalus or extra-axial fluid collection. No age advanced or lobar predominant atrophy. Internal Auditory Canals: There is no cerebellopontine angle mass. The cochleae and semicircular canals are normal. No focal abnormality along the course of the 7th and 8th cranial nerves. Normal porus acusticus and vestibular aqueduct  bilaterally. Vascular: Major intracranial arterial and venous sinus flow voids are preserved. No evidence of chronic microhemorrhage or amyloid angiopathy. Skull and upper cervical spine: The visualized skull base, calvarium, upper cervical spine and extracranial soft tissues are normal. Sinuses/Orbits: No fluid levels or advanced mucosal thickening. No mastoid effusion. Normal orbits. IMPRESSION: 1. Nonspecific scattered foci of white matter hyperintensity. Though this may be seen in the setting of migraine headaches or vasculopathy such as early chronic small vessel disease, it is also seen in normal patients of this age group. The pattern is not suggestive of demyelination. 2. Normal MRI of the internal auditory canals and vestibular structures. Electronically Signed   By: Deatra Robinson M.D.   On: 08/15/2016 21:29    Chart has been reviewed    Assessment/Plan   50 y.o. female with medical history significant of depression admitted for severe persistent vertigo associated with intractable nausea and vomiting  Present on Admission: Vertigo - given no evidence of CVA on MRI and associated sensation of fullness in the ears most likely peripheral in nature. Suspect Mnire's disease. Have reviewed MRI findings with neurology who feels that they nonspecific and nonpathologic. Patient would benefit from OT PT consult and ENT follow-up. We'll treat symptomatically with Atarax when necessary Ativan and rehydrate DM 2 -  hold metformin order SSI Intractable nausea vomiting secondary to vertigo  Other plan as per orders.  DVT prophylaxis:  SCD    Code Status:  FULL CODE as per patient    Family Communication:   Family not  at  Bedside    Disposition Plan:     To home once workup is complete and patient is stable                        Would benefit from PT/OT eval prior to DC   ordered                                         Consults called: none    Admission status:  obs   Level of care   tele            I have spent a total of 56 min on this admission    Annetta Deiss 08/16/2016, 1:18 AM    [facility]  Pager [phone]   after 2 AM please page floor coverage PA If 7AM-7PM, please contact the day team taking care of the patient  [email or web address]  Password [email or web address]

## 2016-08-16 NOTE — Progress Notes (Signed)
Chloe Meyer is a 50 y.o. female with medical history significant for depression presented with vertigo for 2 days associated with nausea being made worse by movement. MRI of the brain negative for stroke. PT with vestibular evalution ordered. If her symptoms do no improve, pleas get ENT evaluation .   Chloe ModyVijaya Coulson Wehner, MD [phone]

## 2016-08-16 NOTE — Progress Notes (Signed)
Physical Therapy Treatment Patient Details Name: Chloe Meyer MRN: [id number] DOB: Mar 18, 1966 Today's Date: 08/16/2016    History of Present Illness Pt was admitted 08/15/16 for vertigo associated with nausea, worse with movement.  PMH significant for depression.  Pt states that symptoms started Saturday night and she went to sleep.  She had some symptoms Sunday but was still able to function and drive.  Symptoms became worse on Monday and she presented to ED.  Pt states that L eye feels funny.  She also states that she has had ear problems in the past, but not like this    PT Comments    Pt assisted again with transfers with intention for education for compensation strategies however this did not work consistently as pt unable to stabilize gaze and nystagmus worsened.  Pt reports meds given in ED helped last night, so pt agreeable to try Meclizine again to see if this alleviates symptoms and RN notified.  Pt would benefit from use of RW upon d/c.   Follow Up Recommendations  Home health PT;Outpatient PT     Equipment Recommendations  Rolling walker with 5" wheels    Recommendations for Other Services       Precautions / Restrictions Precautions Precautions: Fall Restrictions Weight Bearing Restrictions: No    Mobility  Bed Mobility Overal bed mobility: Needs Assistance Bed Mobility: Supine to Sit     Supine to sit: Supervision     General bed mobility comments: pt up in recliner  Transfers Overall transfer level: Needs assistance Equipment used: None Transfers: Sit to/from Stand Sit to Stand: Min guard Stand pivot transfers: Min guard       General transfer comment: pt steadied herself with armrests of 3:1 and recliner, min/guard for safety due to dizziness  Ambulation/Gait   Stairs            Wheelchair Mobility    Modified Rankin (Stroke Patients Only)       Balance                                    Cognition  Arousal/Alertness: Awake/alert Behavior During Therapy: WFL for tasks assessed/performed Overall Cognitive Status: Within Functional Limits for tasks assessed                      Exercises      General Comments        Pertinent Vitals/Pain Pain Assessment: Faces Faces Pain Scale: Hurts little more Pain Location: headache Pain Descriptors / Indicators: Aching;Pressure Pain Intervention(s): Limited activity within patient's tolerance;Monitored during session;Patient requesting pain meds-RN notified    Home Living Family/patient expects to be discharged to:: Private residence Living Arrangements: Alone Available Help at Discharge:  (mother can come stay; doesn't drive.  More assist at night) Type of Home:  (condo) Home Access: Level entry     Home Equipment: None      Prior Function Level of Independence: Independent      Comments: works   PT Goals (current goals can now be found in the care plan section) Acute Rehab PT Goals Patient Stated Goal: return to independence PT Goal Formulation: With patient Time For Goal Achievement: 08/23/16 Potential to Achieve Goals: Good Additional Goals Additional Goal #1: Pt will perform compensation strategies during mobility without cues Progress towards PT goals: Progressing toward goals    Frequency    Min 3X/week  PT Plan Current plan remains appropriate    Co-evaluation PT/OT/SLP Co-Evaluation/Treatment: Yes Reason for Co-Treatment: For patient/therapist safety (vestibular) PT goals addressed during session: Mobility/safety with mobility OT goals addressed during session: ADL's and self-care;Proper use of Adaptive equipment and DME     End of Session Equipment Utilized During Treatment: Gait belt Activity Tolerance: Patient tolerated treatment well Patient left: in chair;with call bell/phone within reach     Time: 1535-1602 PT Time Calculation (min) (ACUTE ONLY): 27 min  Charges:  $Therapeutic  Activity: 8-22 mins                    G Codes:  Functional Assessment Tool Used: clinical judgement Functional Limitation: Mobility: Walking and moving around Mobility: Walking and Moving Around Current Status ([id number]([id number]): At least 1 percent but less than 20 percent impaired, limited or restricted Mobility: Walking and Moving Around Goal Status [phone](G8979): At least 1 percent but less than 20 percent impaired, limited or restricted   Manette Doto,KATHrine E 08/16/2016, 4:59 PM Zenovia JarredKati Dean Goldner, PT, DPT 08/16/2016 Pager: [phone]

## 2016-08-16 NOTE — Evaluation (Addendum)
Occupational Therapy Evaluation and Vestibular Evaluation Patient Details Name: Chloe Meyer MRN: [id number] DOB: 11/20/1965 Today's Date: 08/16/2016    History of Present Illness Pt was admitted for vertigo associated with nausea, worse with movement.  PMH significant for depression.  Pt states that symptoms started Saturday night and she went to sleep.  She had some symptoms Sunday but was still able to function and drive.  Symptoms became worse on Monday and she presented to ED.  Pt states that L eye feels funny.  She also states that she has had ear problems in the past, but not like this   Clinical Impression   Pt was admitted for the above.  Performed limited vestibular evaluation as pt has slight nystagmus at rest, and it becomes worse with focusing on object.  Unable to proceed further due to this.  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 Will follow in acute setting with supervision level goals and will further educate on compensation/exercises as appropriate.      Follow Up Recommendations  Supervision/Assistance - 24 hour (HHPT for vestibular rehab unless she can arrange transportation to OP).  Her mother can stay with her but does not drive and will not able to guard her balance when walking   Equipment Recommendations  3 in 1 bedside comode    Recommendations for Other Services       Precautions / Restrictions Precautions Precautions: Fall Restrictions Weight Bearing Restrictions: No      Mobility Bed Mobility               General bed mobility comments: oob  Transfers Overall transfer level: Needs assistance Equipment used: None Transfers: Sit to/from Stand;Stand Pivot Transfers Sit to Stand: Min guard Stand pivot transfers: Min guard       General transfer comment: pt steadied herself with armrests of 3:1 and recliner    Balance                                             ADL Overall ADL's : Needs assistance/impaired                         Toilet Transfer: Min guard;BSC;RW;Stand-pivot             General ADL Comments: pt needs overall min guard for LB adls for sit to stand and set up for UB adls   VESTIBULAR EVALUATION                              Pt presents with small L beating nystagmus at rest.  This increases when she tries to focus on an object.  Nystagmus gets             Worse when eyes turned to fast phase of nystagmus, which follows Alexander's Law and is indicative of a R hypofunction.  Nystagmus is L beating with any eye movement. She is unable to stabilize gaze, and nystagmus worsened.  Eye ROM                                                                              In symptoms.  Head shaking does increase her symptoms. Initially gave pt compensation of trying to focus on something that                                                                              Wasn't moving.  It did not work for her consistently  Vision  pt wears contacts (one for distance and one for near), but they are not here nor her glasses.   Perception     Praxis      Pertinent Vitals/Pain Pain Assessment: Faces Faces Pain Scale: Hurts little more Pain Location: headache Pain Descriptors / Indicators: Aching Pain Intervention(s): Limited activity within patient's tolerance;Monitored during session;Patient requesting pain meds-RN notified     Hand Dominance     Extremity/Trunk Assessment Upper Extremity Assessment Upper Extremity Assessment: Overall WFL for tasks assessed           Communication Communication Communication: No difficulties   Cognition Arousal/Alertness: Awake/alert Behavior During Therapy: WFL for tasks assessed/performed Overall Cognitive Status: Within Functional Limits for  tasks assessed                     General Comments       Exercises       Shoulder Instructions      Home Living Family/patient expects to be discharged to:: Private residence Living Arrangements: Alone Available Help at Discharge:  (mother can come stay; doesn't drive.  More assist at night) Type of Home:  (condo) Home Access: Level entry               Bathroom Toilet: Standard     Home Equipment: None          Prior Functioning/Environment Level of Independence: Independent        Comments: works        OT Problem List: Decreased strength;Decreased activity tolerance;Impaired balance (sitting and/or standing);Pain;Impaired vision/perception   OT Treatment/Interventions: Self-care/ADL training;DME and/or AE instruction;Patient/family education;Balance training (vestibular exercises and compensation)    OT Goals(Current goals can be found in the care plan section) Acute Rehab OT Goals Patient Stated Goal: return to independence OT Goal Formulation: With patient Time For Goal Achievement: 08/23/16 Potential to Achieve Goals: Good ADL Goals Pt Will Transfer to Toilet: with supervision;ambulating;bedside commode;regular height toilet Additional ADL Goal #1: pt will complete adl with set up only Additional ADL Goal #2: pt will be independent with R hypofunction exercises and compensation strategies  OT Frequency: Min 2X/week   Barriers to D/C:            Co-evaluation PT/OT/SLP Co-Evaluation/Treatment: Yes Reason for Co-Treatment: For patient/therapist  safety (vestibular assessment) PT goals addressed during session: Mobility/safety with mobility OT goals addressed during session: ADL's and self-care;Proper use of Adaptive equipment and DME      End of Session    Activity Tolerance: Patient limited by fatigue Patient left: in chair;with call bell/phone within reach;with chair alarm set   Time: 1610-9604 OT Time Calculation (min): 31  min Charges:  OT General Charges $OT Visit: 1 Procedure OT Evaluation $OT Eval Low Complexity: 1 Procedure G-Codes: OT G-codes **NOT FOR INPATIENT CLASS** Functional Assessment Tool Used: clinical observation and judgment Functional Limitation: Self care Self Care Current Status (V4098): At least 1 percent but less than 20 percent impaired, limited or restricted Self Care Goal Status (J1914): At least 1 percent but less than 20 percent impaired, limited or restricted  [facility] 08/16/2016, 4:34 PM  Marica Otter, OTR/L [phone] 08/16/2016

## 2016-08-17 DIAGNOSIS — R112 Nausea with vomiting, unspecified: Secondary | ICD-10-CM | POA: Diagnosis not present

## 2016-08-17 DIAGNOSIS — R42 Dizziness and giddiness: Secondary | ICD-10-CM | POA: Diagnosis not present

## 2016-08-17 LAB — GLUCOSE, CAPILLARY
GLUCOSE-CAPILLARY: 133 mg/dL — AB (ref 65–99)
GLUCOSE-CAPILLARY: 252 mg/dL — AB (ref 65–99)
Glucose-Capillary: 106 mg/dL — ABNORMAL HIGH (ref 65–99)
Glucose-Capillary: 202 mg/dL — ABNORMAL HIGH (ref 65–99)

## 2016-08-17 LAB — HEMOGLOBIN A1C
HEMOGLOBIN A1C: 7.2 % — AB (ref 4.8–5.6)
MEAN PLASMA GLUCOSE: 160 mg/dL

## 2016-08-17 MED ORDER — PREDNISONE 20 MG PO TABS
40.0000 mg | ORAL_TABLET | Freq: Every day | ORAL | Status: DC
  Filled 2016-08-17: qty 2

## 2016-08-17 MED ORDER — PREDNISONE 20 MG PO TABS
40.0000 mg | ORAL_TABLET | Freq: Every day | ORAL | Status: DC
  Administered 2016-08-17 – 2016-08-18 (×2): 40 mg via ORAL
  Filled 2016-08-17: qty 2

## 2016-08-17 MED ORDER — DIAZEPAM 2 MG PO TABS: 2.0000 mg | ORAL_TABLET | Freq: Three times a day (TID) | ORAL | Status: DC | PRN

## 2016-08-17 MED ORDER — MECLIZINE HCL 25 MG PO TABS: 50.0000 mg | ORAL_TABLET | Freq: Three times a day (TID) | ORAL | Status: DC

## 2016-08-17 MED ORDER — MECLIZINE HCL 25 MG PO TABS
25.0000 mg | ORAL_TABLET | Freq: Three times a day (TID) | ORAL | Status: DC
  Administered 2016-08-17 – 2016-08-18 (×4): 25 mg via ORAL
  Filled 2016-08-17 (×4): qty 1

## 2016-08-17 NOTE — Progress Notes (Signed)
Physical Therapy Treatment Patient Details Name: Chloe DoomMelissa G Throne MRN: [id number] DOB: [birth year] Today's Date: 08/17/2016    History of Present Illness Pt was admitted 08/15/16 for vertigo associated with nausea, worse with movement.  PMH significant for depression.  Pt states that symptoms started Saturday night and she went to sleep.  She had some symptoms Sunday but was still able to function and drive.  Symptoms became worse on Monday and she presented to ED.  Pt states that L eye feels funny.  She also states that she has had ear problems in the past, but not like this    PT Comments    Pt tolerated ambulation with RW very well today only reporting 2/10 dizziness when utilizing compensation techniques.  Follow Up Recommendations  Home health PT     Equipment Recommendations  Rolling walker with 5" wheels    Recommendations for Other Services       Precautions / Restrictions Precautions Precautions: Fall Restrictions Weight Bearing Restrictions: No    Mobility  Bed Mobility               General bed mobility comments: pt up in recliner  Transfers Overall transfer level: Needs assistance Equipment used: Rolling walker (2 wheeled) Transfers: Sit to/from Stand Sit to Stand: Supervision         General transfer comment: verbal cues for hand placement, supervision for safety due to dizziness  Ambulation/Gait Ambulation/Gait assistance: Min guard Ambulation Distance (Feet): 400 Feet Assistive device: Rolling walker (2 wheeled) Gait Pattern/deviations: Step-through pattern;Decreased stride length     General Gait Details: slow but steady gait with RW, cues for pt to perform compensation with focusing on stationary object on R side (less symptoms with R focus - dizziness 2/10), pt reports an increase in symptoms with midline focus   Stairs            Wheelchair Mobility    Modified Rankin (Stroke Patients Only)       Balance                                     Cognition Arousal/Alertness: Awake/alert Behavior During Therapy: WFL for tasks assessed/performed Overall Cognitive Status: Within Functional Limits for tasks assessed                      Exercises      General Comments        Pertinent Vitals/Pain Pain Assessment: No/denies pain    Home Living                      Prior Function            PT Goals (current goals can now be found in the care plan section) Progress towards PT goals: Progressing toward goals    Frequency    Min 3X/week      PT Plan Current plan remains appropriate    Co-evaluation             End of Session Equipment Utilized During Treatment: Gait belt Activity Tolerance: Patient tolerated treatment well Patient left: in chair;with call bell/phone within reach     Time: 1125-1147 PT Time Calculation (min) (ACUTE ONLY): 22 min  Charges:  $Gait Training: 8-22 mins                    G Codes:  Naimah Yingst,KATHrine E 08/17/2016, 12:51 PM Zenovia JarredKati Porchea Charrier, PT, DPT 08/17/2016 Pager: [phone]

## 2016-08-17 NOTE — Progress Notes (Signed)
PROGRESS NOTE                                                                                                                                                                                                             Patient Demographics:    Chloe Meyer, is a 50 y.o. female, DOB - 26-Apr-1966, ZOX:[id number]  Admit date - 08/15/2016   Admitting Physician Therisa DoyneAnastassia Doutova, MD  Outpatient Primary MD for the patient is Clarnce FlockWhite, Cynthia G, RN (Inactive)  LOS - 0  Outpatient Specialists:none  Chief Complaint  Patient presents with  . Dizziness  . Nausea       Brief Narrative   50 year old female with history of depression presented with 2 day history of vertigo present both on lying down or movement (worsened with getting up with eyes opened and moving around) associated with nausea and vomiting, blurred vision. Denies any new medications except for recently started on metformin for diabetes and refill of her Lexapro. No  recent illness (except for mild flulike symptoms after she receives her Robaxin 3 weeks back). No prior symptoms. Denied any weakness, tingling or numbness in her extremities. Denied fullness in her ears, hearing impairment or tinnitus. Prior history of migraine but has been asymptomatic for many years. CT and MRI brain negative for stroke or acute abnormality.     Subjective:   Still has significant dizziness on getting up and ambulating, although improved since admission. Informs her vision to be Clearer today.   Assessment  & Plan :    Active Problems:   Acute Vertigo  -Suspect severe benign positional vertigo versus vestibular neuritis. MRI brain negative for acute stroke or acute abnormality. Still symptomatic and having horizontal nystagmus with unsteady gait. Patient at high fall risk at present and unsafe to be discharged home alone. -Switch meclizine to every 8 hours scheduled. Add low-dose  when necessary Valium and daily prednisone (can help with vestibular Neuritis). -Seen by physical therapy and recommend home health PT with a rolling walker. -Possibly discharge home tomorrow if improved further.    Diabetes mellitus (HCC) Recently diagnosed. A1c of 7.2. Continue metformin.     Intractable nausea and vomiting Improved. No further symptoms today. Continue when necessary Zofran  Severe depression Continue Lexapro.       Code Status : Full code  Family Communication  :  None at bedside  Disposition Plan  : Home likely tomorrow if improved  Barriers For Discharge : Active symptoms  Consults  :  None  Procedures  : MRI brain  DVT Prophylaxis  :  Lovenox -   Lab Results  Component Value Date   PLT 410 (H) 08/16/2016    Antibiotics  :    Anti-infectives    None        Objective:   Vitals:   08/16/16 0454 08/16/16 1518 08/16/16 2023 08/17/16 0519  BP: 134/88 131/77 140/79 130/83  Pulse: 77 78 72 71  Resp: 18 18 18 15   Temp: 98 F (36.7 C) 98.3 F (36.8 C) 98.5 F (36.9 C) 97.7 F (36.5 C)  TempSrc: Oral Oral Oral Oral  SpO2: 95% 96% 95% 95%  Weight: 99 kg (218 lb 4.8 oz)     Height: 5\' 1"  (1.549 m)       Wt Readings from Last 3 Encounters:  08/16/16 99 kg (218 lb 4.8 oz)  08/15/16 99.3 kg (219 lb)  07/01/13 99.5 kg (219 lb 6.4 oz)     Intake/Output Summary (Last 24 hours) at 08/17/16 1243 Last data filed at 08/16/16 1800  Gross per 24 hour  Intake              600 ml  Output                0 ml  Net              600 ml     Physical Exam  Gen: not in distress HEENT: no pallor, moist mucosa, supple neck, Horizontal nystagmus Chest: clear b/l, no added sounds CVS: N S1&S2, no murmurs,  GI: soft, NT, ND Musculoskeletal: warm, no edema CNS: Alert and oriented, nonfocal, unsteady ambulation    Data Review:    CBC  Recent Labs Lab 08/15/16 1342 08/16/16 0448  WBC 11.0* 12.0*  HGB 13.6 12.4  HCT 41.9 37.8  PLT 443*  410*  MCV 89.7 88.9  MCH 29.1 29.2  MCHC 32.5 32.8  RDW 13.9 14.0  LYMPHSABS 1.5  --   MONOABS 0.3  --   EOSABS 0.1  --   BASOSABS 0.0  --     Chemistries   Recent Labs Lab 08/15/16 1342 08/16/16 0448  NA 140 141  K 4.3 4.0  CL 106 106  CO2 26 28  GLUCOSE 163* 121*  BUN 14 13  CREATININE 0.60 0.61  CALCIUM 8.7* 8.9  MG  --  2.3  AST  --  16  ALT  --  14  ALKPHOS  --  60  BILITOT  --  0.9   ------------------------------------------------------------------------------------------------------------------ No results for input(s): CHOL, HDL, LDLCALC, TRIG, CHOLHDL, LDLDIRECT in the last 72 hours.  Lab Results  Component Value Date   HGBA1C 7.2 (H) 08/16/2016   ------------------------------------------------------------------------------------------------------------------  Recent Labs  08/16/16 0448  TSH 0.965   ------------------------------------------------------------------------------------------------------------------ No results for input(s): VITAMINB12, FOLATE, FERRITIN, TIBC, IRON, RETICCTPCT in the last 72 hours.  Coagulation profile No results for input(s): INR, PROTIME in the last 168 hours.  No results for input(s): DDIMER in the last 72 hours.  Cardiac Enzymes No results for input(s): CKMB, TROPONINI, MYOGLOBIN in the last 168 hours.  Invalid input(s): CK ------------------------------------------------------------------------------------------------------------------ No results found for: BNP  Inpatient Medications  Scheduled Meds: . escitalopram  20 mg Oral QHS  . insulin aspart  0-5 Units Subcutaneous QHS  . insulin aspart  0-9 Units  Subcutaneous TID WC  . loratadine  10 mg Oral Daily  . meclizine  25 mg Oral TID  . predniSONE  40 mg Oral Q breakfast  . sodium chloride flush  3 mL Intravenous Q12H   Continuous Infusions: PRN Meds:.acetaminophen **OR** acetaminophen, diazepam, ondansetron **OR** ondansetron (ZOFRAN) IV  Micro  Results No results found for this or any previous visit (from the past 240 hour(s)).  Radiology Reports Ct Head Wo Contrast  Result Date: 08/15/2016 CLINICAL DATA:  Per EMS, Pt, from home, c/o intermittent dizziness w/ movement and nausea x 2 days. Denies pain. EMS reports "she is ok if she keeps her eyes closed." pt.states lt. Eye abnormal on exam per MD EXAM: CT HEAD WITHOUT CONTRAST TECHNIQUE: Contiguous axial images were obtained from the base of the skull through the vertex without intravenous contrast. COMPARISON:  None. FINDINGS: Brain: No evidence of acute infarction, hemorrhage, hydrocephalus, extra-axial collection or mass lesion/mass effect. Vascular: No hyperdense vessel or unexpected calcification. Skull: Normal. Negative for fracture or focal lesion. Sinuses/Orbits: No acute finding. Other: None. IMPRESSION: Negative exam. Electronically Signed   By: Norva PavlovElizabeth  Brown M.D.   On: 08/15/2016 14:33   Mr Brain Wo Contrast  Result Date: 08/15/2016 CLINICAL DATA:  Vertigo, nystagmus EXAM: MRI HEAD WITHOUT CONTRAST TECHNIQUE: Multiplanar, multiecho pulse sequences of the brain and surrounding structures were obtained without intravenous contrast. COMPARISON:  Head CT 08/15/2016 FINDINGS: Brain: No acute infarct or intraparenchymal hemorrhage. The midline structures are normal. There is multifocal hyperintense T2-weighted signal within the periventricular white matter, which may be seen in the setting of migraine headaches or early chronic microvascular disease; however, it is also seen in normal patients of this age. No mass lesion or midline shift. No hydrocephalus or extra-axial fluid collection. No age advanced or lobar predominant atrophy. Internal Auditory Canals: There is no cerebellopontine angle mass. The cochleae and semicircular canals are normal. No focal abnormality along the course of the 7th and 8th cranial nerves. Normal porus acusticus and vestibular aqueduct bilaterally. Vascular:  Major intracranial arterial and venous sinus flow voids are preserved. No evidence of chronic microhemorrhage or amyloid angiopathy. Skull and upper cervical spine: The visualized skull base, calvarium, upper cervical spine and extracranial soft tissues are normal. Sinuses/Orbits: No fluid levels or advanced mucosal thickening. No mastoid effusion. Normal orbits. IMPRESSION: 1. Nonspecific scattered foci of white matter hyperintensity. Though this may be seen in the setting of migraine headaches or vasculopathy such as early chronic small vessel disease, it is also seen in normal patients of this age group. The pattern is not suggestive of demyelination. 2. Normal MRI of the internal auditory canals and vestibular structures. Electronically Signed   By: Deatra RobinsonKevin  Herman M.D.   On: 08/15/2016 21:29    Time Spent in minutes  25   Eddie NorthHUNGEL, Jaxzen Vanhorn M.D on 08/17/2016 at 12:43 PM  Between 7am to 7pm - Pager - [phone]  After 7pm go to [email or web address] - password [email or web address]  [facility] -  Office  [phone]

## 2016-08-17 NOTE — Progress Notes (Signed)
Occupational Therapy Treatment Patient Details Name: Chloe DoomMelissa G Wethington MRN: [id number] DOB: 1966/09/16 Today's Date: 08/17/2016    History of present illness Pt was admitted 08/15/16 for vertigo associated with nausea, worse with movement.  PMH significant for depression.  Pt states that symptoms started Saturday night and she went to sleep.  She had some symptoms Sunday but was still able to function and drive.  Symptoms became worse on Monday and she presented to ED.  Pt states that L eye feels funny.  She also states that she has had ear problems in the past, but not like this   OT comments  Did further vestibular testing today--pt is not following a typical hypofunction. Focusing on compensation at this time  Follow Up Recommendations  Supervision/Assistance - 24 hour    Equipment Recommendations  3 in 1 bedside comode    Recommendations for Other Services      Precautions / Restrictions Precautions Precautions: Fall Restrictions Weight Bearing Restrictions: No       Mobility Bed Mobility               General bed mobility comments: oob  Transfers   Equipment used: Rolling walker (2 wheeled)   Sit to Stand: Supervision              Balance                                   ADL Overall ADL's : Needs assistance/impaired     Grooming: Min Production designer, theatre/television/filmguard;Standing                   Toilet Transfer: Min guard;Ambulation;BSC;RW             General ADL Comments: ambulated to bathroom.  Pt steady with walker but min guard given for safety.  Pt's IV started to leak, so returned to sitting and called RN. Pt has a standard tub at home.  Showed her how she can use a 3:1 facing outward, supporting legs on outside of tub on trash can, and cutting inexpensive shower curtain liner to contain water.  She can have a friend come to help when she showers.    Vestibular:    Pt reports spinning is less, she still has some but it has changed.  Nystagmus  still present.  Tested Head Thrust. Pt able to keep eyes on therapists nose but nystagmus still present.  This did not make symptoms worse.    Tried to turn head to see if she could keep a target focused at a distance, and this exacerbated symptoms.  It took several minutes for symptoms to calm down and only looking at target on R side seemed to help slow spinning.  Did not give exercises.  Focusing on compensation, focusing on something on R side that is immobile. Pt reports that she normally keeps head still when walking and driving, ? motion sensitivity.        Vision                     Perception     Praxis      Cognition   Behavior During Therapy: [facility] LLCWFL for tasks assessed/performed Overall Cognitive Status: Within Functional Limits for tasks assessed                       Extremity/Trunk Assessment  Exercises     Shoulder Instructions       General Comments      Pertinent Vitals/ Pain       Pain Assessment: No/denies pain  Home Living                                          Prior Functioning/Environment              Frequency  Min 2X/week        Progress Toward Goals  OT Goals(current goals can now be found in the care plan section)  Progress towards OT goals: Progressing toward goals     Plan      Co-evaluation                 End of Session     Activity Tolerance Other (comment) (leaking IV)   Patient Left in chair;with call bell/phone within reach   Nurse Communication          Time: [id number] OT Time Calculation (min): 26 min  Charges: OT General Charges $OT Visit: 1 Procedure OT Treatments $Therapeutic Activity: 8-22 mins  Maven Rosander 08/17/2016, 10:07 AM  Marica OtterMaryellen Breannah Kratt, OTR/L [phone] 08/17/2016

## 2016-08-18 DIAGNOSIS — R42 Dizziness and giddiness: Secondary | ICD-10-CM

## 2016-08-18 DIAGNOSIS — F32A Depression, unspecified: Secondary | ICD-10-CM

## 2016-08-18 DIAGNOSIS — F329 Major depressive disorder, single episode, unspecified: Secondary | ICD-10-CM | POA: Diagnosis not present

## 2016-08-18 DIAGNOSIS — R112 Nausea with vomiting, unspecified: Secondary | ICD-10-CM | POA: Diagnosis not present

## 2016-08-18 LAB — GLUCOSE, CAPILLARY: GLUCOSE-CAPILLARY: 105 mg/dL — AB (ref 65–99)

## 2016-08-18 MED ORDER — PREDNISONE 20 MG PO TABS
40.0000 mg | ORAL_TABLET | Freq: Every day | ORAL | 0 refills | Status: AC
Start: 2016-08-18 — End: ?

## 2016-08-18 MED ORDER — MECLIZINE HCL 25 MG PO TABS: 25.0000 mg | ORAL_TABLET | Freq: Three times a day (TID) | ORAL | 0 refills | Status: AC | PRN

## 2016-08-18 NOTE — Discharge Summary (Signed)
Physician Discharge Summary  DEBE ANFINSON ONG:[id number] DOB: 1966-09-24 DOA: 08/15/2016  PCP: Clarnce Flock, RN (Inactive)  Admit date: 08/15/2016 Discharge date: 08/18/2016  Admitted From: home Disposition:  home   Home Health:  Ordered   Equipment/Devices:  Rolling walker    Discharge Condition:  stable   CODE STATUS:  Full code   Diet recommendation:  Heart healthy, diabetic Consultations:      Discharge Diagnoses:  Active Problems:   Vertigo   Diabetes mellitus (HCC)   Nausea and vomiting   Depression    Subjective: Dizziness has improved significantly. No nausea or vomiting.   Brief Summary: 50 year old female with history of depression presented with 2 day history of vertigo present both on lying down or movement (worsened with getting up with eyes opened and moving around) associated with nausea and vomiting, blurred vision. Denies any new medications except for recently started on metformin for diabetes and refill of her Lexapro. No  recent illness (except for mild flulike symptoms after she receives her Robaxin 3 weeks back). No prior symptoms. Denied any weakness, tingling or numbness in her extremities. Denied fullness in her ears, hearing impairment or tinnitus. Prior history of migraine but has been asymptomatic for many years. CT and MRI brain negative for stroke or acute abnormality.     Hospital Course:  Active Problems:   Acute Vertigo  -Suspect severe benign positional vertigo versus vestibular neuritis. MRI brain negative for acute stroke or acute abnormality. Still symptomatic and having horizontal nystagmus with unsteady gait. Patient at high fall risk at present and unsafe to be discharged home alone. -will be discharged with meclizine to every 8 hours scheduled and daily prednisone for 1 more day. -Seen by physical therapy and recommend home health PT with a rolling walker     Diabetes mellitus (HCC) Recently diagnosed. A1c of 7.2.  Continue metformin.    Intractable nausea and vomiting Improved.    Severe depression Continue Lexapro.   Discharge Instructions  Discharge Instructions    Diet - low sodium heart healthy    Complete by:  As directed    Diet Carb Modified    Complete by:  As directed    Increase activity slowly    Complete by:  As directed        Medication List    TAKE these medications   cetirizine 10 MG tablet Commonly known as:  ZYRTEC Take 10 mg by mouth daily.   escitalopram 20 MG tablet Commonly known as:  LEXAPRO Take 20 mg by mouth at bedtime.   fluocinonide cream 0.05 % Commonly known as:  LIDEX Apply 1 application topically 2 (two) times daily as needed (for eczema on arms).   meclizine 25 MG tablet Commonly known as:  ANTIVERT Take 1 tablet (25 mg total) by mouth 3 (three) times daily as needed for dizziness.   metFORMIN 500 MG 24 hr tablet Commonly known as:  GLUCOPHAGE-XR Take 500 mg by mouth every evening.   naproxen sodium 220 MG tablet Commonly known as:  ANAPROX Take 220 mg by mouth 2 (two) times daily with a meal.   predniSONE 20 MG tablet Commonly known as:  DELTASONE Take 2 tablets (40 mg total) by mouth daily with breakfast.      Follow-up Information    ROSEN, JEFRY, MD Follow up.   Specialty:  Otolaryngology Why:  you can call this office to make the soonest possible appointment.  Contact information: [address]  [place] [id number] [phone]          No Known Allergies   Procedures/Studies:    Ct Head Wo Contrast  Result Date: 08/15/2016 CLINICAL DATA:  Per EMS, Pt, from home, c/o intermittent dizziness w/ movement and nausea x 2 days. Denies pain. EMS reports "she is ok if she keeps her eyes closed." pt.states lt. Eye abnormal on exam per MD EXAM: CT HEAD WITHOUT CONTRAST TECHNIQUE: Contiguous axial images were obtained from the base of the skull through the vertex without intravenous contrast. COMPARISON:   None. FINDINGS: Brain: No evidence of acute infarction, hemorrhage, hydrocephalus, extra-axial collection or mass lesion/mass effect. Vascular: No hyperdense vessel or unexpected calcification. Skull: Normal. Negative for fracture or focal lesion. Sinuses/Orbits: No acute finding. Other: None. IMPRESSION: Negative exam. Electronically Signed   By: Norva PavlovElizabeth  Brown M.D.   On: 08/15/2016 14:33   Mr Brain Wo Contrast  Result Date: 08/15/2016 CLINICAL DATA:  Vertigo, nystagmus EXAM: MRI HEAD WITHOUT CONTRAST TECHNIQUE: Multiplanar, multiecho pulse sequences of the brain and surrounding structures were obtained without intravenous contrast. COMPARISON:  Head CT 08/15/2016 FINDINGS: Brain: No acute infarct or intraparenchymal hemorrhage. The midline structures are normal. There is multifocal hyperintense T2-weighted signal within the periventricular white matter, which may be seen in the setting of migraine headaches or early chronic microvascular disease; however, it is also seen in normal patients of this age. No mass lesion or midline shift. No hydrocephalus or extra-axial fluid collection. No age advanced or lobar predominant atrophy. Internal Auditory Canals: There is no cerebellopontine angle mass. The cochleae and semicircular canals are normal. No focal abnormality along the course of the 7th and 8th cranial nerves. Normal porus acusticus and vestibular aqueduct bilaterally. Vascular: Major intracranial arterial and venous sinus flow voids are preserved. No evidence of chronic microhemorrhage or amyloid angiopathy. Skull and upper cervical spine: The visualized skull base, calvarium, upper cervical spine and extracranial soft tissues are normal. Sinuses/Orbits: No fluid levels or advanced mucosal thickening. No mastoid effusion. Normal orbits. IMPRESSION: 1. Nonspecific scattered foci of white matter hyperintensity. Though this may be seen in the setting of migraine headaches or vasculopathy such as early  chronic small vessel disease, it is also seen in normal patients of this age group. The pattern is not suggestive of demyelination. 2. Normal MRI of the internal auditory canals and vestibular structures. Electronically Signed   By: Deatra RobinsonKevin  Herman M.D.   On: 08/15/2016 21:29       Discharge Exam: Vitals:   08/17/16 2037 08/18/16 0502  BP: (!) 168/91 (!) 128/93  Pulse: 88 72  Resp: 18 18  Temp: 97.9 F (36.6 C) 98.4 F (36.9 C)   Vitals:   08/17/16 0519 08/17/16 1428 08/17/16 2037 08/18/16 0502  BP: 130/83 (!) 161/98 (!) 168/91 (!) 128/93  Pulse: 71 86 88 72  Resp: 15 18 18 18   Temp: 97.7 F (36.5 C) 98 F (36.7 C) 97.9 F (36.6 C) 98.4 F (36.9 C)  TempSrc: Oral Oral Oral Oral  SpO2: 95% 94% 95% 97%  Weight:      Height:        General: Pt is alert, awake, not in acute distress Cardiovascular: RRR, S1/S2 +, no rubs, no gallops Respiratory: CTA bilaterally, no wheezing, no rhonchi Abdominal: Soft, NT, ND, bowel sounds + Extremities: no edema, no cyanosis    The results of significant diagnostics from this hospitalization (including imaging, microbiology, ancillary and laboratory) are listed below for reference.     Microbiology: No  results found for this or any previous visit (from the past 240 hour(s)).   Labs: BNP (last 3 results) No results for input(s): BNP in the last 8760 hours. Basic Metabolic Panel:  Recent Labs Lab 08/15/16 1342 08/16/16 0448  NA 140 141  K 4.3 4.0  CL 106 106  CO2 26 28  GLUCOSE 163* 121*  BUN 14 13  CREATININE 0.60 0.61  CALCIUM 8.7* 8.9  MG  --  2.3  PHOS  --  3.4   Liver Function Tests:  Recent Labs Lab 08/16/16 0448  AST 16  ALT 14  ALKPHOS 60  BILITOT 0.9  PROT 6.5  ALBUMIN 3.6   No results for input(s): LIPASE, AMYLASE in the last 168 hours. No results for input(s): AMMONIA in the last 168 hours. CBC:  Recent Labs Lab 08/15/16 1342 08/16/16 0448  WBC 11.0* 12.0*  NEUTROABS 9.1*  --   HGB 13.6 12.4   HCT 41.9 37.8  MCV 89.7 88.9  PLT 443* 410*   Cardiac Enzymes: No results for input(s): CKTOTAL, CKMB, CKMBINDEX, TROPONINI in the last 168 hours. BNP: Invalid input(s): POCBNP CBG:  Recent Labs Lab 08/17/16 0752 08/17/16 1151 08/17/16 1713 08/17/16 2029 08/18/16 0725  GLUCAP 133* 106* 202* 252* 105*   D-Dimer No results for input(s): DDIMER in the last 72 hours. Hgb A1c  Recent Labs  08/16/16 0448  HGBA1C 7.2*   Lipid Profile No results for input(s): CHOL, HDL, LDLCALC, TRIG, CHOLHDL, LDLDIRECT in the last 72 hours. Thyroid function studies  Recent Labs  08/16/16 0448  TSH 0.965   Anemia work up No results for input(s): VITAMINB12, FOLATE, FERRITIN, TIBC, IRON, RETICCTPCT in the last 72 hours. Urinalysis No results found for: COLORURINE, APPEARANCEUR, LABSPEC, PHURINE, GLUCOSEU, HGBUR, BILIRUBINUR, KETONESUR, PROTEINUR, UROBILINOGEN, NITRITE, LEUKOCYTESUR Sepsis Labs Invalid input(s): PROCALCITONIN,  WBC,  LACTICIDVEN Microbiology No results found for this or any previous visit (from the past 240 hour(s)).   Time coordinating discharge: Over 30 minutes  SIGNED:   Calvert CantorIZWAN,Nila Winker, MD  [facility] 08/18/2016, 11:06 AM Pager   If 7PM-7AM, please contact night-coverage [email or web address] Password [email or web address]

## 2016-08-18 NOTE — Discharge Instructions (Signed)
Do not drive until your symptoms of Vertigo have completely resolved.   Please take all your medications with you for your next visit with your Primary MD. Please request your Primary MD to go over all hospital test results at the follow up. Please ask your Primary MD to get all Hospital records sent to his/her office.  If you experience worsening of your admission symptoms, develop shortness of breath, chest pain, suicidal or homicidal thoughts or a life threatening emergency, you must seek medical attention immediately by calling 911 or calling your MD.  Bonita QuinYou must read the complete instructions/literature along with all the possible adverse reactions/side effects for all the medicines you take including new medications that have been prescribed to you. Take new medicines after you have completely understood and accpet all the possible adverse reactions/side effects.   Do not drive when taking pain medications or sedatives.    Do not take more than prescribed Pain, Sleep and Anxiety Medications  If you have smoked or chewed Tobacco in the last 2 yrs please stop. Stop any regular alcohol and or recreational drug use.  Wear Seat belts while driving.

## 2016-08-18 NOTE — Progress Notes (Signed)
Date: August 18, 2016 Discharge orders checked for needs. Home health care arranged for P.T. Through [facility]. Marcelle Smilinghonda Davis, RN, BSN, [place]   [phone]

## 2016-08-18 NOTE — Progress Notes (Signed)
Pt VSS. No vertigo reported. IV removed. Tele d/c'd. AVS and med administration reviewed at bedside. Work note provided. Referral for follow up provided. No further questions. Justin Mendaudle, Lilliauna Van H, RN

## 2016-08-18 NOTE — Progress Notes (Signed)
Physical Therapy Treatment Patient Details Name: Chloe Meyer MRN: [id number] DOB: 07/17/1966 Today's Date: 08/18/2016    History of Present Illness Pt was admitted 08/15/16 for vertigo associated with nausea, worse with movement.  PMH significant for depression.  Pt states that symptoms started Saturday night and she went to sleep.  She had some symptoms Sunday but was still able to function and drive.  Symptoms became worse on Monday and she presented to ED.  Pt states that L eye feels funny.  She also states that she has had ear problems in the past, but not like this    PT Comments    Pt reports feeling much better today and able to tolerate focusing on stationary object in midline!  Follow Up Recommendations  Home health PT     Equipment Recommendations  Rolling walker with 5" wheels    Recommendations for Other Services       Precautions / Restrictions Precautions Precautions: Fall    Mobility  Bed Mobility               General bed mobility comments: pt up in recliner  Transfers Overall transfer level: Needs assistance Equipment used: Rolling walker (2 wheeled) Transfers: Sit to/from Stand Sit to Stand: Supervision         General transfer comment: pt utilizing RW well  Ambulation/Gait Ambulation/Gait assistance: Supervision Ambulation Distance (Feet): 400 Feet Assistive device: Rolling walker (2 wheeled) Gait Pattern/deviations: Step-through pattern     General Gait Details: slow but steady gait with RW, pt able to focus midline today without worsening dizziness, educated to continue compensating to R side if dizziness worsens with midline, dizziness 1/10    Stairs            Wheelchair Mobility    Modified Rankin (Stroke Patients Only)       Balance                                    Cognition Arousal/Alertness: Awake/alert Behavior During Therapy: WFL for tasks assessed/performed Overall Cognitive Status:  Within Functional Limits for tasks assessed                      Exercises      General Comments        Pertinent Vitals/Pain Pain Assessment: No/denies pain    Home Living                      Prior Function            PT Goals (current goals can now be found in the care plan section) Progress towards PT goals: Progressing toward goals    Frequency    Min 3X/week      PT Plan Current plan remains appropriate    Co-evaluation             End of Session   Activity Tolerance: Patient tolerated treatment well Patient left: in chair;with call bell/phone within reach     Time: 1050-1102 PT Time Calculation (min) (ACUTE ONLY): 12 min  Charges:  $Gait Training: 8-22 mins                    G Codes:      Galina Haddox,KATHrine E 08/18/2016, 4:53 PM Zenovia JarredKati Kalene Cutler, PT, DPT 08/18/2016 Pager: [phone]

## 2017-08-13 IMAGING — MR MR HEAD W/O CM
9 of 11 series · 36 of 48 positions shown · non-contrast
Comparison: Head CT 08/15/2016

CLINICAL DATA: Vertigo, nystagmus

EXAM:
MRI HEAD WITHOUT CONTRAST
TECHNIQUE: Multiplanar, multiecho pulse sequences of the brain and surrounding
structures were obtained without intravenous contrast.

[Series 3: T1 · sagittal · 5.0mm · 0.47mm/px · 3 of 25 slices shown]
[im 1/25]
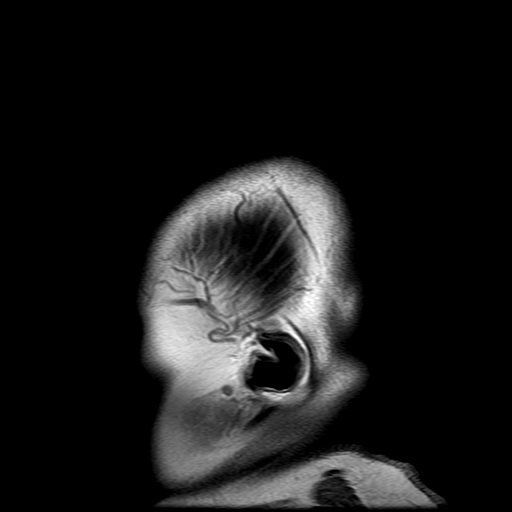
[im 13/25]
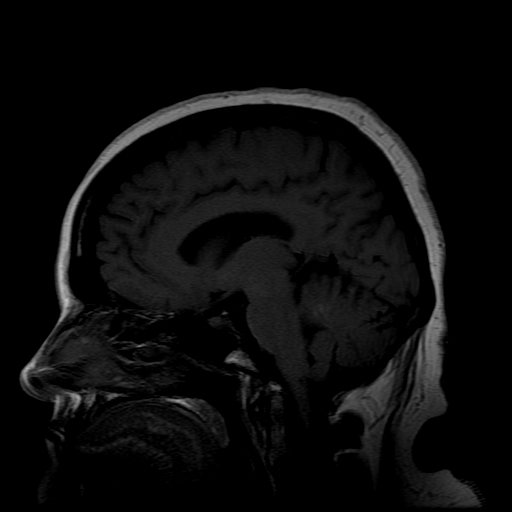
[im 25/25]
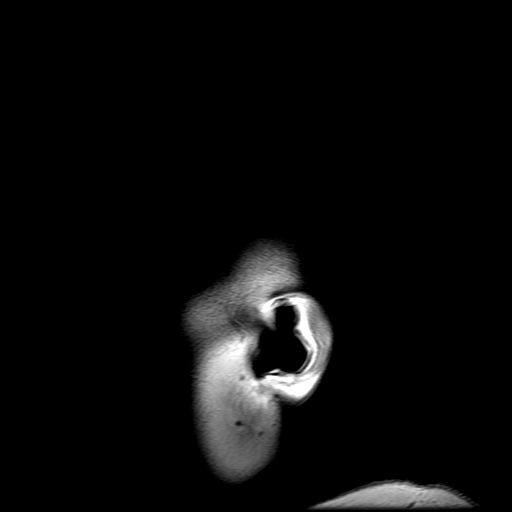

[Series 4: DWI · axial · 3.0mm · 1.09mm/px · z∈[-52,+98]mm · 11 of 102 slices shown (1 of 4)]
[im 1/102]
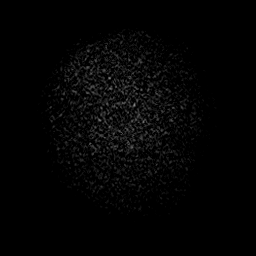
[im 11/102]
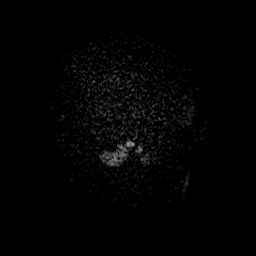
[im 21/102]
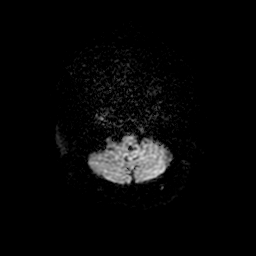
[im 31/102]
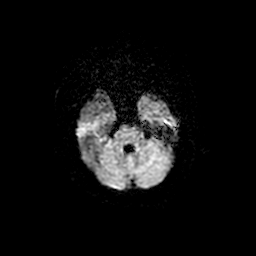
[im 41/102]
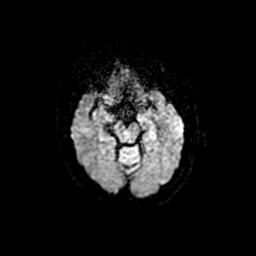
[im 51/102]
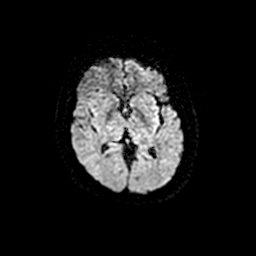
[im 61/102]
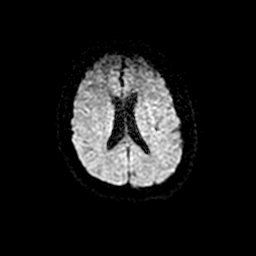
[im 71/102]
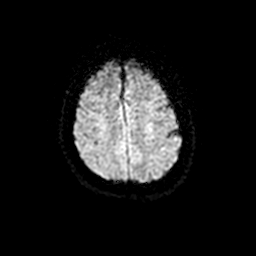
[im 81/102]
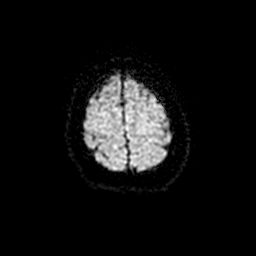
[im 91/102]
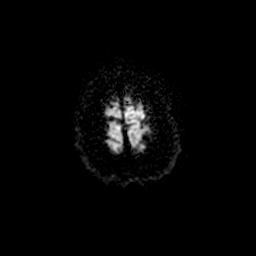
[im 102/102]
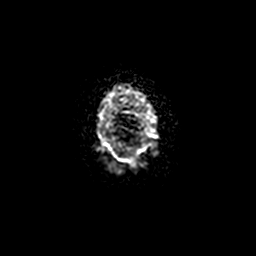

[Series 5: DWI · coronal · 5.0mm · 1.09mm/px · 6 of 68 slices shown (2 of 4)]
[im 1/68]
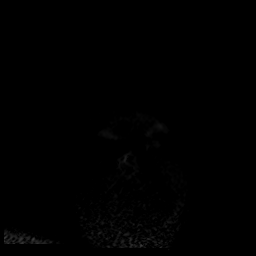
[im 14/68]
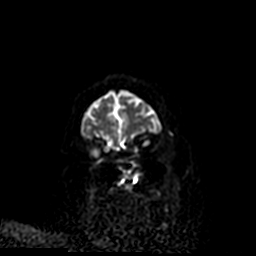
[im 27/68]
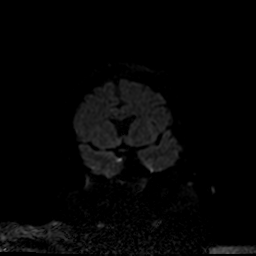
[im 41/68]
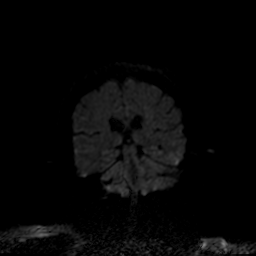
[im 54/68]
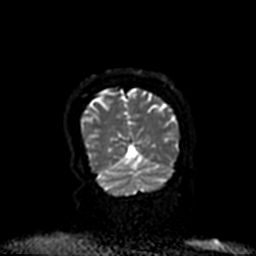
[im 68/68]
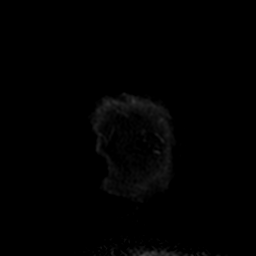

[Series 6: T2 · axial · 5.0mm · 0.43mm/px · z∈[-67,+79]mm · 2 of 24 slices shown (1 of 2)]
[im 1/24]
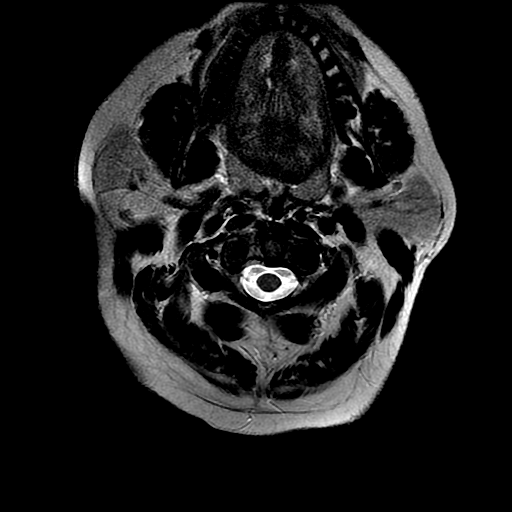
[im 24/24]
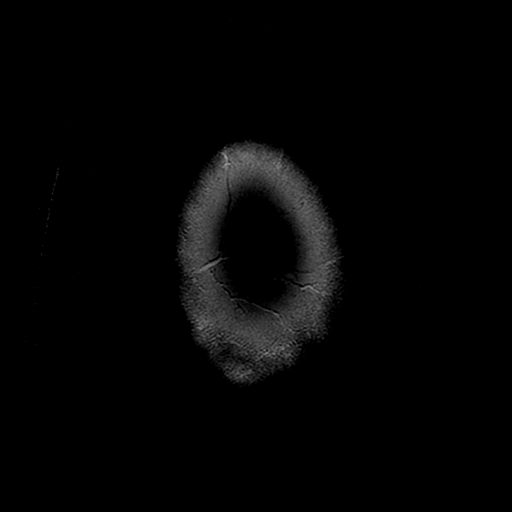

[Series 7: FLAIR · axial · 5.0mm · 0.43mm/px · z∈[-73,+85]mm · 2 of 24 slices shown]
[im 1/24]
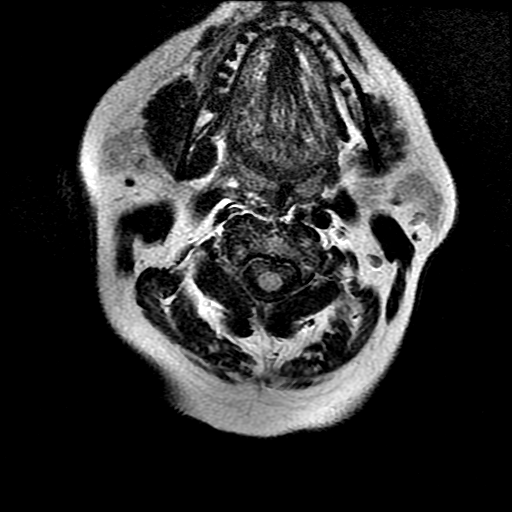
[im 24/24]
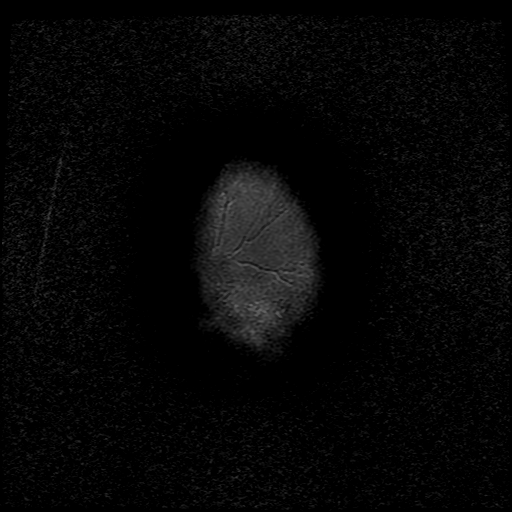

[Series 8: ax mpgr · axial · 5.0mm · 0.43mm/px · z∈[-73,+85]mm · 2 of 24 slices shown]
[im 1/24]
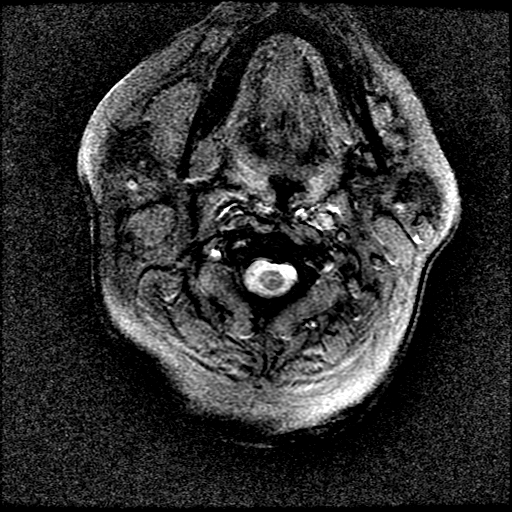
[im 24/24]
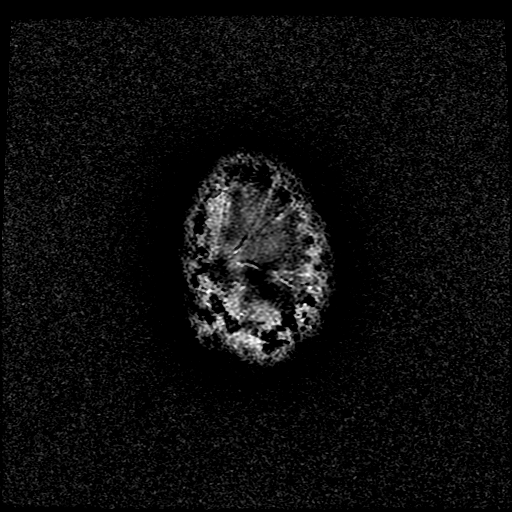

[Series 9: T2 · coronal · 5.0mm · 0.45mm/px · 2 of 26 slices shown (2 of 2)]
[im 1/26]
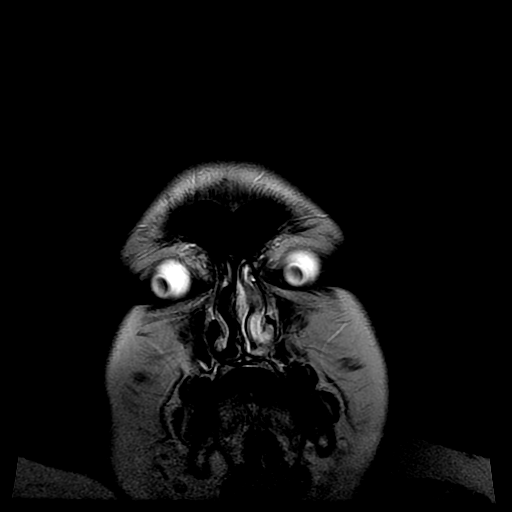
[im 26/26]
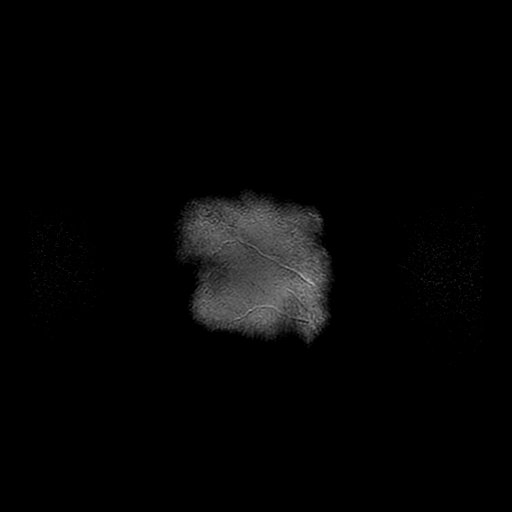

[Series 400: DWI · axial · 3.0mm · 1.09mm/px · z∈[-52,+98]mm · 5 of 51 slices shown (3 of 4)]
[im 1/51]
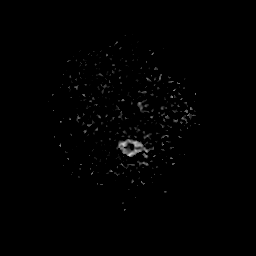
[im 13/51]
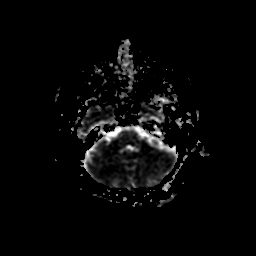
[im 26/51]
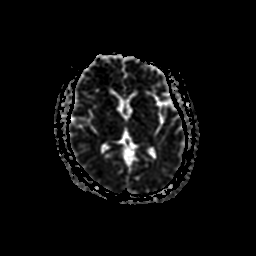
[im 38/51]
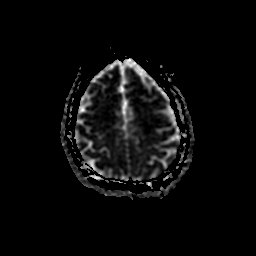
[im 51/51]
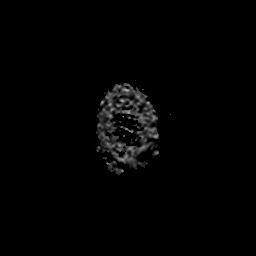

[Series 500: DWI · coronal · 5.0mm · 1.09mm/px · 3 of 34 slices shown (4 of 4)]
[im 1/34]
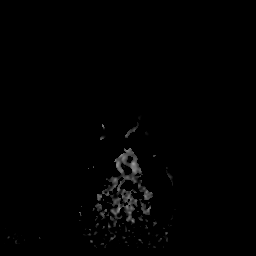
[im 17/34]
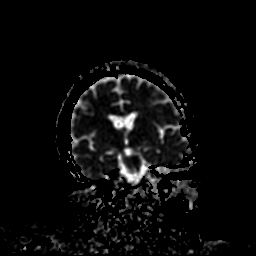
[im 34/34]
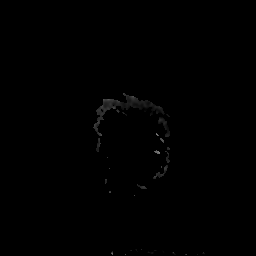

[36 of 48 positions shown; findings below may reference images not displayed]

FINDINGS: Brain: No acute infarct or intraparenchymal hemorrhage. The midline
structures are normal. There is multifocal hyperintense T2-weighted
signal within the periventricular white matter, which may be seen in
the setting of migraine headaches or early chronic microvascular
disease; however, it is also seen in normal patients of this age. No
mass lesion or midline shift. No hydrocephalus or extra-axial fluid
collection. No age advanced or lobar predominant atrophy.

Internal Auditory Canals: There is no cerebellopontine angle mass.
The cochleae and semicircular canals are normal. No focal
abnormality along the course of the 7th and 8th cranial nerves.
Normal porus acusticus and vestibular aqueduct bilaterally.

Vascular: Major intracranial arterial and venous sinus flow voids
are preserved. No evidence of chronic microhemorrhage or amyloid
angiopathy.

Skull and upper cervical spine: The visualized skull base,
calvarium, upper cervical spine and extracranial soft tissues are
normal.

Sinuses/Orbits: No fluid levels or advanced mucosal thickening. No
mastoid effusion. Normal orbits.
IMPRESSION: 1. Nonspecific scattered foci of white matter hyperintensity. Though
this may be seen in the setting of migraine headaches or
vasculopathy such as early chronic small vessel disease, it is also
seen in normal patients of this age group. The pattern is not
suggestive of demyelination.
2. Normal MRI of the internal auditory canals and vestibular
structures.

## 2020-08-16 ENCOUNTER — Other Ambulatory Visit (HOSPITAL_COMMUNITY): Payer: Self-pay | Admitting: Family

## 2020-08-16 DIAGNOSIS — U071 COVID-19: Secondary | ICD-10-CM

## 2020-08-16 NOTE — Progress Notes (Signed)
I connected by phone with Darron Doom on 08/16/2020 at 5:43 PM to discuss the potential use of a new treatment for mild to moderate COVID-19 viral infection in non-hospitalized patients.  This patient is a 54 y.o. female that meets the FDA criteria for Emergency Use Authorization of COVID monoclonal antibody casirivimab/imdevimab, bamlanivimab/eteseviamb, or sotrovimab.  Lives with a person with a (+) direct SARS-CoV-2 viral test result  Has mild or moderate COVID-19   Is NOT hospitalized due to COVID-19  Is within 10 days of symptom onset  Has at least one of the high risk factor(s) for progression to severe COVID-19 and/or hospitalization as defined in EUA.  Specific high risk criteria : BMI > 25 and Diabetes   Symptoms of H/A, sneezing, sinus congestion, runny nose began 08/12/20.   I have spoken and communicated the following to the patient or parent/caregiver regarding COVID monoclonal antibody treatment:  1. FDA has authorized the emergency use for the treatment of mild to moderate COVID-19 in adults and pediatric patients with positive results of direct SARS-CoV-2 viral testing who are 38 years of age and older weighing at least 40 kg, and who are at high risk for progressing to severe COVID-19 and/or hospitalization.  2. The significant known and potential risks and benefits of COVID monoclonal antibody, and the extent to which such potential risks and benefits are unknown.  3. Information on available alternative treatments and the risks and benefits of those alternatives, including clinical trials.  4. Patients treated with COVID monoclonal antibody should continue to self-isolate and use infection control measures (e.g., wear mask, isolate, social distance, avoid sharing personal items, clean and disinfect high touch surfaces, and frequent handwashing) according to CDC guidelines.   5. The patient or parent/caregiver has the option to accept or refuse COVID monoclonal  antibody treatment.  After reviewing this information with the patient, the patient has agreed to receive one of the available covid 19 monoclonal antibodies and will be provided an appropriate fact sheet prior to infusion. Morton Stall, NP 08/16/2020 5:43 PM

## 2020-08-17 ENCOUNTER — Ambulatory Visit (HOSPITAL_COMMUNITY)
Admission: RE | Admit: 2020-08-17 | Discharge: 2020-08-17 | Disposition: A | Discharge status: Home / Self Care | Payer: 59 | Source: Ambulatory Visit | Attending: Pulmonary Disease | Admitting: Pulmonary Disease

## 2020-08-17 DIAGNOSIS — U071 COVID-19: Secondary | ICD-10-CM | POA: Diagnosis not present

## 2020-08-17 MED ORDER — EPINEPHRINE 0.3 MG/0.3ML IJ SOAJ: 0.3000 mg | Freq: Once | INTRAMUSCULAR | Status: DC | PRN

## 2020-08-17 MED ORDER — FAMOTIDINE IN NACL 20-0.9 MG/50ML-% IV SOLN: 20.0000 mg | Freq: Once | INTRAVENOUS | Status: DC | PRN

## 2020-08-17 MED ORDER — ALBUTEROL SULFATE HFA 108 (90 BASE) MCG/ACT IN AERS: 2.0000 | INHALATION_SPRAY | Freq: Once | RESPIRATORY_TRACT | Status: DC | PRN

## 2020-08-17 MED ORDER — SOTROVIMAB 500 MG/8ML IV SOLN
500.0000 mg | Freq: Once | INTRAVENOUS | Status: AC
  Administered 2020-08-17: 500 mg via INTRAVENOUS

## 2020-08-17 MED ORDER — DIPHENHYDRAMINE HCL 50 MG/ML IJ SOLN: 50.0000 mg | Freq: Once | INTRAMUSCULAR | Status: DC | PRN

## 2020-08-17 MED ORDER — METHYLPREDNISOLONE SODIUM SUCC 125 MG IJ SOLR: 125.0000 mg | Freq: Once | INTRAMUSCULAR | Status: DC | PRN

## 2020-08-17 MED ORDER — SODIUM CHLORIDE 0.9 % IV SOLN: INTRAVENOUS | Status: DC | PRN

## 2020-08-17 NOTE — Progress Notes (Signed)
Diagnosis: COVID-19  Physician: Dr. Patrick Wright  Procedure: [facility] Med: Sotrovimab infusion - Provided patient with sotrovimab fact sheet for patients, parents, and caregivers prior to infusion.   Complications: No immediate complications noted  Discharge: Discharged home    

## 2020-08-17 NOTE — Discharge Instructions (Signed)

## 2022-09-06 ENCOUNTER — Ambulatory Visit
Admission: EM | Admit: 2022-09-06 | Discharge: 2022-09-06 | Disposition: A | Discharge status: Home / Self Care | Payer: BC Managed Care – PPO | Attending: Urgent Care | Admitting: Urgent Care

## 2022-09-06 DIAGNOSIS — R058 Other specified cough: Secondary | ICD-10-CM | POA: Diagnosis not present

## 2022-09-06 DIAGNOSIS — E119 Type 2 diabetes mellitus without complications: Secondary | ICD-10-CM | POA: Insufficient documentation

## 2022-09-06 DIAGNOSIS — Z7952 Long term (current) use of systemic steroids: Secondary | ICD-10-CM | POA: Insufficient documentation

## 2022-09-06 DIAGNOSIS — J309 Allergic rhinitis, unspecified: Secondary | ICD-10-CM | POA: Insufficient documentation

## 2022-09-06 DIAGNOSIS — Z79899 Other long term (current) drug therapy: Secondary | ICD-10-CM | POA: Diagnosis not present

## 2022-09-06 DIAGNOSIS — Z7984 Long term (current) use of oral hypoglycemic drugs: Secondary | ICD-10-CM | POA: Diagnosis not present

## 2022-09-06 DIAGNOSIS — Z1152 Encounter for screening for COVID-19: Secondary | ICD-10-CM | POA: Insufficient documentation

## 2022-09-06 DIAGNOSIS — B349 Viral infection, unspecified: Secondary | ICD-10-CM | POA: Diagnosis not present

## 2022-09-06 MED ORDER — IPRATROPIUM BROMIDE 0.03 % NA SOLN: 2.0000 | Freq: Two times a day (BID) | NASAL | 0 refills | Status: AC

## 2022-09-06 MED ORDER — PROMETHAZINE-DM 6.25-15 MG/5ML PO SYRP: 2.5000 mL | ORAL_SOLUTION | Freq: Three times a day (TID) | ORAL | 0 refills | Status: DC | PRN

## 2022-09-06 MED ORDER — CETIRIZINE HCL 10 MG PO TABS: 10.0000 mg | ORAL_TABLET | Freq: Every day | ORAL | 0 refills | Status: AC

## 2022-09-06 NOTE — ED Provider Notes (Signed)
[facility]  Note:  This document was prepared using Conservation officer, historic buildings and may include unintentional dictation errors.  MRN: [id number] DOB: [birth year]  Subjective:   Chloe Meyer is a 56 y.o. female presenting for 3 day history of acute onset sinus pressure, sinus congestion, sinus headaches, bilateral ear fullness and pain.  Has had an occasional cough.  No chest pain, shortness of breath or wheezing.  No current facility-administered medications for this encounter.  Current Outpatient Medications:    lisinopril (ZESTRIL) 10 MG tablet, Take 1 tablet by mouth daily., Disp: , Rfl:    cetirizine (ZYRTEC) 10 MG tablet, Take 10 mg by mouth daily., Disp: , Rfl:    Dulaglutide (TRULICITY) 0.75 MG/0.5ML SOPN, 1 mL (1.5 mg total) every 7 days., Disp: , Rfl:    DUPIXENT 300 MG/2ML SOPN, Inject into the skin., Disp: , Rfl:    escitalopram (LEXAPRO) 20 MG tablet, Take 20 mg by mouth at bedtime. , Disp: , Rfl:    fluocinonide cream (LIDEX) 0.05 %, Apply 1 application topically 2 (two) times daily as needed (for eczema on arms)., Disp: , Rfl:    meclizine (ANTIVERT) 25 MG tablet, Take 1 tablet (25 mg total) by mouth 3 (three) times daily as needed for dizziness., Disp: 30 tablet, Rfl: 0   metFORMIN (GLUCOPHAGE-XR) 500 MG 24 hr tablet, Take 500 mg by mouth every evening. , Disp: , Rfl: 3   naproxen sodium (ANAPROX) 220 MG tablet, Take 220 mg by mouth 2 (two) times daily with a meal., Disp: , Rfl:    predniSONE (DELTASONE) 20 MG tablet, Take 2 tablets (40 mg total) by mouth daily with breakfast., Disp: 2 tablet, Rfl: 0   No Known Allergies  Past Medical History:  Diagnosis Date   Depression      Past Surgical History:  Procedure Laterality Date   CHOLECYSTECTOMY     TONSILLECTOMY     TUBAL LIGATION      Family History  Problem Relation Age of Onset   Depression Mother    Hypertension Mother    Diabetes Father    Stroke Father    Depression  Sister    Hypertension Sister     Social History   Tobacco Use   Smoking status: Never   Smokeless tobacco: Never  Substance Use Topics   Alcohol use: Yes    Comment: social   Drug use: No    ROS   Objective:   Vitals: BP (!) 140/94 (BP Location: Left Arm)   Pulse (!) 111   Temp 97.9 F (36.6 C) (Oral)   Resp 16   SpO2 100%   Wt Readings from Last 3 Encounters:  08/16/16 218 lb 4.8 oz (99 kg)  08/15/16 219 lb (99.3 kg)  07/01/13 219 lb 6.4 oz (99.5 kg)   Temp Readings from Last 3 Encounters:  09/06/22 97.9 F (36.6 C) (Oral)  08/17/20 98.3 F (36.8 C) (Oral)  08/18/16 98.4 F (36.9 C) (Oral)   BP Readings from Last 3 Encounters:  09/06/22 (!) 140/94  08/17/20 135/87  08/18/16 (!) 128/93   Pulse Readings from Last 3 Encounters:  09/06/22 (!) 111  08/17/20 72  08/18/16 72   Physical Exam Constitutional:      General: She is not in acute distress.    Appearance: Normal appearance. She is well-developed and normal weight. She is not ill-appearing, toxic-appearing or diaphoretic.  HENT:     Head: Normocephalic and atraumatic.  Right Ear: Tympanic membrane, ear canal and external ear normal. No drainage or tenderness. No middle ear effusion. There is no impacted cerumen. Tympanic membrane is not erythematous or bulging.     Left Ear: Tympanic membrane, ear canal and external ear normal. No drainage or tenderness.  No middle ear effusion. There is no impacted cerumen. Tympanic membrane is not erythematous or bulging.     Nose: Congestion present. No rhinorrhea.     Mouth/Throat:     Mouth: Mucous membranes are moist. No oral lesions.     Pharynx: No pharyngeal swelling, oropharyngeal exudate, posterior oropharyngeal erythema or uvula swelling.     Tonsils: No tonsillar exudate or tonsillar abscesses.     Comments: Post-nasal drainage. Eyes:     General: No scleral icterus.       Right eye: No discharge.        Left eye: No discharge.     Extraocular  Movements: Extraocular movements intact.     Right eye: Normal extraocular motion.     Left eye: Normal extraocular motion.     Conjunctiva/sclera: Conjunctivae normal.  Cardiovascular:     Rate and Rhythm: Normal rate and regular rhythm.     Heart sounds: Normal heart sounds. No murmur heard.    No friction rub. No gallop.  Pulmonary:     Effort: Pulmonary effort is normal. No respiratory distress.     Breath sounds: No stridor. No wheezing, rhonchi or rales.  Chest:     Chest wall: No tenderness.  Musculoskeletal:     Cervical back: Normal range of motion and neck supple.  Lymphadenopathy:     Cervical: No cervical adenopathy.  Skin:    General: Skin is warm and dry.  Neurological:     General: No focal deficit present.     Mental Status: She is alert and oriented to person, place, and time.     Cranial Nerves: No cranial nerve deficit, dysarthria or facial asymmetry.     Motor: No weakness.     Coordination: Coordination normal.     Gait: Gait normal.  Psychiatric:        Mood and Affect: Mood normal.        Behavior: Behavior normal.     Assessment and Plan :   PDMP not reviewed this encounter.  1. Acute viral syndrome   2. Type 2 diabetes mellitus treated without insulin (HCC)   3. Allergic rhinitis, unspecified seasonality, unspecified trigger     Patient would be a viable candidate to undergo COVID-19 treatment with Paxlovid.  She has no history of CKD, had recent blood work with her PCP that confirmed she has normal kidney function per patient.  Does not want blood work from [place].  Deferred imaging given clear cardiopulmonary exam, hemodynamically stable vital signs. Will manage for viral illness such as viral URI, viral syndrome, viral rhinitis, COVID-19. Recommended supportive care. Offered scripts for symptomatic relief. Testing is pending. Counseled patient on potential for adverse effects with medications prescribed/recommended today, ER and return-to-clinic  precautions discussed, patient verbalized understanding.     Wallis Bamberg, [place] 09/06/22 1839

## 2022-09-06 NOTE — ED Triage Notes (Signed)
Pt c/o bilateral ear pain and sinus pressure x 1 week.

## 2022-09-06 NOTE — Discharge Instructions (Addendum)
We will notify you of your test results as they arrive and may take between about 24 hours.  I encourage you to sign up for MyChart if you have not already done so as this can be the easiest way for us to communicate results to you online or through a phone app.  Generally, we only contact you if it is a positive test result.  In the meantime, if you develop worsening symptoms including fever, chest pain, shortness of breath despite our current treatment plan then please report to the emergency room as this may be a sign of worsening status from possible viral infection.  Otherwise, we will manage this as a viral syndrome. For sore throat or cough try using a honey-based tea. Use 3 teaspoons of honey with juice squeezed from half lemon. Place shaved pieces of ginger into 1/2-1 cup of water and warm over stove top. Then mix the ingredients and repeat every 4 hours as needed. Please take Tylenol 500mg-650mg every 6 hours for aches and pains, fevers. Hydrate very well with at least 2 liters of water. Eat light meals such as soups to replenish electrolytes and soft fruits, veggies. Start an antihistamine like Zyrtec for postnasal drainage, sinus congestion.  You can take this together with the nose spray as needed for the same kind of congestion.  Use the cough medications as needed.  

## 2022-09-07 LAB — SARS CORONAVIRUS 2 (TAT 6-24 HRS): SARS Coronavirus 2: NEGATIVE

## 2022-12-28 ENCOUNTER — Other Ambulatory Visit: Payer: Self-pay

## 2022-12-28 DIAGNOSIS — M79605 Pain in left leg: Secondary | ICD-10-CM

## 2022-12-29 ENCOUNTER — Ambulatory Visit (HOSPITAL_BASED_OUTPATIENT_CLINIC_OR_DEPARTMENT_OTHER): Payer: BC Managed Care – PPO

## 2023-03-01 ENCOUNTER — Encounter: Payer: Self-pay | Admitting: Neurology

## 2023-03-01 ENCOUNTER — Ambulatory Visit: Payer: Self-pay | Admitting: Neurology

## 2023-10-23 ENCOUNTER — Ambulatory Visit
Admission: EM | Admit: 2023-10-23 | Discharge: 2023-10-23 | Disposition: A | Discharge status: Home / Self Care | Payer: BC Managed Care – PPO | Attending: Emergency Medicine | Admitting: Emergency Medicine

## 2023-10-23 DIAGNOSIS — Z2089 Contact with and (suspected) exposure to other communicable diseases: Secondary | ICD-10-CM | POA: Diagnosis not present

## 2023-10-23 DIAGNOSIS — R051 Acute cough: Secondary | ICD-10-CM | POA: Diagnosis not present

## 2023-10-23 MED ORDER — PROMETHAZINE-DM 6.25-15 MG/5ML PO SYRP: 5.0000 mL | ORAL_SOLUTION | Freq: Four times a day (QID) | ORAL | 0 refills | Status: AC | PRN

## 2023-10-23 MED ORDER — AZITHROMYCIN 250 MG PO TABS: ORAL_TABLET | ORAL | 0 refills | Status: AC

## 2023-10-23 NOTE — ED Triage Notes (Signed)
Pt presents with c/o cough, sore throat and headaches. Pt states she has had a fever x 2 nights. States she husband had PNA recently.

## 2023-10-23 NOTE — ED Provider Notes (Signed)
[facility]    CSN: [id number] Arrival date & time: 10/23/23  1137      History   Chief Complaint Chief Complaint  Patient presents with   Cough   Headache   Sore Throat    HPI Chloe Meyer is a 58 y.o. female.  2-3 day history of fever, dry cough, sore throat, body aches Tmax 100.9 last night.  She took Tylenol before bed Feels cough is starting to become productive today.  Feels a rattle in the chest Her husband has been sick with similar symptoms.  3 days ago he was seen at urgent care and diagnosed with pneumonia Patient reports other sick contacts as well  Past Medical History:  Diagnosis Date   Depression     Patient Active Problem List   Diagnosis Date Noted   Depression 08/18/2016   Vertigo 08/16/2016   Diabetes mellitus (HCC) 08/16/2016   Nausea and vomiting 08/16/2016    Past Surgical History:  Procedure Laterality Date   CHOLECYSTECTOMY     TONSILLECTOMY     TUBAL LIGATION      OB History   No obstetric history on file.      Home Medications    Prior to Admission medications   Medication Sig Start Date End Date Taking? Authorizing Provider  azithromycin (ZITHROMAX) 250 MG tablet Take 2 tablets together on day 1, then take 1 tablet daily for 4 days 10/23/23  Yes Juron Vorhees, Lurena Joiner, PA-C  promethazine-dextromethorphan (PROMETHAZINE-DM) 6.25-15 MG/5ML syrup Take 5 mLs by mouth 4 (four) times daily as needed for cough. 10/23/23  Yes Fernandez Kenley, Lurena Joiner, PA-C  cetirizine (ZYRTEC ALLERGY) 10 MG tablet Take 1 tablet (10 mg total) by mouth daily. 09/06/22   Wallis Bamberg, PA-C  Dulaglutide (TRULICITY) 0.75 MG/0.5ML SOPN 1 mL (1.5 mg total) every 7 days. 11/27/21   [provider]  DUPIXENT 300 MG/2ML SOPN Inject into the skin.    [provider]  escitalopram (LEXAPRO) 20 MG tablet Take 20 mg by mouth at bedtime.  11/30/12   [provider]  fluocinonide cream (LIDEX) 0.05 % Apply 1 application topically 2 (two) times daily  as needed (for eczema on arms).    [provider]  ipratropium (ATROVENT) 0.03 % nasal spray Place 2 sprays into both nostrils 2 (two) times daily. 09/06/22   Wallis Bamberg, PA-C  lisinopril (ZESTRIL) 10 MG tablet Take 1 tablet by mouth daily. 07/29/21   [provider]  meclizine (ANTIVERT) 25 MG tablet Take 1 tablet (25 mg total) by mouth 3 (three) times daily as needed for dizziness. 08/18/16   Calvert Cantor, MD  metFORMIN (GLUCOPHAGE-XR) 500 MG 24 hr tablet Take 500 mg by mouth every evening.  06/21/16   [provider]  naproxen sodium (ANAPROX) 220 MG tablet Take 220 mg by mouth 2 (two) times daily with a meal.    [provider]  predniSONE (DELTASONE) 20 MG tablet Take 2 tablets (40 mg total) by mouth daily with breakfast. 08/18/16   Calvert Cantor, MD    Family History Family History  Problem Relation Age of Onset   Depression Mother    Hypertension Mother    Diabetes Father    Stroke Father    Depression Sister    Hypertension Sister     Social History Social History   Tobacco Use   Smoking status: Never   Smokeless tobacco: Never  Substance Use Topics   Alcohol use: Yes    Comment: social  Drug use: No     Allergies   Dapagliflozin and Metformin and related   Review of Systems Review of Systems Per HPI  Physical Exam Triage Vital Signs ED Triage Vitals  Encounter Vitals Group     BP 10/23/23 1229 121/85     Systolic BP Percentile --      Diastolic BP Percentile --      Pulse Rate 10/23/23 1228 (!) 108     Resp 10/23/23 1228 17     Temp 10/23/23 1228 98.6 F (37 C)     Temp Source 10/23/23 1228 Oral     SpO2 10/23/23 1228 92 %     Weight --      Height --      Head Circumference --      Peak Flow --      Pain Score 10/23/23 1227 0     Pain Loc --      Pain Education --      Exclude from Growth Chart --    No data found.  Updated Vital Signs BP 121/85   Pulse 98   Temp 99.9 F (37.7 C)   Resp 17   SpO2  92%    Physical Exam Vitals and nursing note reviewed.  Constitutional:      Appearance: She is not ill-appearing.  HENT:     Right Ear: Tympanic membrane and ear canal normal.     Left Ear: Tympanic membrane and ear canal normal.     Nose: Congestion present. No rhinorrhea.     Mouth/Throat:     Mouth: Mucous membranes are moist.     Pharynx: Oropharynx is clear. No posterior oropharyngeal erythema.  Eyes:     Conjunctiva/sclera: Conjunctivae normal.  Cardiovascular:     Rate and Rhythm: Normal rate and regular rhythm.     Pulses: Normal pulses.     Heart sounds: Normal heart sounds.  Pulmonary:     Effort: Pulmonary effort is normal. No respiratory distress.     Breath sounds: No wheezing or rales.     Comments: Coarse sounds lower lobes Musculoskeletal:     Cervical back: Normal range of motion.  Lymphadenopathy:     Cervical: No cervical adenopathy.  Skin:    General: Skin is warm and dry.  Neurological:     Mental Status: She is alert and oriented to person, place, and time.      UC Treatments / Results  Labs (all labs ordered are listed, but only abnormal results are displayed) Labs Reviewed - No data to display  EKG   Radiology No results found.  Procedures Procedures (including critical care time)  Medications Ordered in UC Medications - No data to display  Initial Impression / Assessment and Plan / UC Course  I have reviewed the triage vital signs and the nursing notes.  Pertinent labs & imaging results that were available during my care of the patient were reviewed by me and considered in my medical decision making (see chart for details).  Temp 100 in clinic Patient with close pneumonia exposure and is worried about her symptoms. She does not want to have a chest xray done today. With fever, coarse lung sounds, husband dx with PNA will go ahead and treat with azithromycin. Advised other symptomatic care and medication use. Sent promethazine.  Return and ED precautions. Patient is agreeable to plan, all questions answered  Final Clinical Impressions(s) / UC Diagnoses   Final diagnoses:  Acute cough  Pneumonia  exposure     Discharge Instructions      Take the azithromycin as prescribed The promethazine DM cough syrup can be used up to 4 times daily. If this medication makes you drowsy, take only once before bed. Drink lots of fluids!  If no change after medicines or you feel worse, please return     ED Prescriptions     Medication Sig Dispense Auth. Provider   azithromycin (ZITHROMAX) 250 MG tablet Take 2 tablets together on day 1, then take 1 tablet daily for 4 days 6 tablet Eain Mullendore, PA-C   promethazine-dextromethorphan (PROMETHAZINE-DM) 6.25-15 MG/5ML syrup Take 5 mLs by mouth 4 (four) times daily as needed for cough. 240 mL Marleta Lapierre, Lurena Joiner, PA-C      PDMP not reviewed this encounter.   Marlow Baars, [place] 10/23/23 2130

## 2023-10-23 NOTE — Discharge Instructions (Addendum)
Take the azithromycin as prescribed The promethazine DM cough syrup can be used up to 4 times daily. If this medication makes you drowsy, take only once before bed. Drink lots of fluids!  If no change after medicines or you feel worse, please return

## 2024-09-17 ENCOUNTER — Encounter: Payer: Self-pay | Admitting: Psychology

## 2024-11-18 ENCOUNTER — Encounter: Admitting: Psychology
